# Patient Record
Sex: Female | Born: 1985 | Race: Black or African American | Hispanic: No | Marital: Single | Smoking: Never smoker
Health system: Southern US, Community
[De-identification: ages and names within clinical notes are randomized; demographics above are authoritative.]

## PROBLEM LIST (undated history)

## (undated) DIAGNOSIS — N939 Abnormal uterine and vaginal bleeding, unspecified: Secondary | ICD-10-CM

## (undated) DIAGNOSIS — I10 Essential (primary) hypertension: Secondary | ICD-10-CM

## (undated) DIAGNOSIS — D649 Anemia, unspecified: Secondary | ICD-10-CM

## (undated) DIAGNOSIS — L732 Hidradenitis suppurativa: Secondary | ICD-10-CM

## (undated) DIAGNOSIS — N946 Dysmenorrhea, unspecified: Secondary | ICD-10-CM

## (undated) DIAGNOSIS — E119 Type 2 diabetes mellitus without complications: Secondary | ICD-10-CM

## (undated) HISTORY — DX: Dysmenorrhea, unspecified: N94.6

## (undated) HISTORY — DX: Abnormal uterine and vaginal bleeding, unspecified: N93.9

## (undated) HISTORY — DX: Essential (primary) hypertension: I10

## (undated) HISTORY — DX: Type 2 diabetes mellitus without complications: E11.9

## (undated) HISTORY — DX: Hidradenitis suppurativa: L73.2

---
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
## 2003-12-01 ENCOUNTER — Ambulatory Visit: Payer: Self-pay | Admitting: Family Medicine

## 2004-07-07 ENCOUNTER — Inpatient Hospital Stay (HOSPITAL_COMMUNITY): Admission: AD | Admit: 2004-07-07 | Discharge: 2004-07-07 | Payer: Self-pay | Admitting: Obstetrics & Gynecology

## 2004-07-07 ENCOUNTER — Ambulatory Visit: Payer: Self-pay | Admitting: Family Medicine

## 2004-08-08 ENCOUNTER — Ambulatory Visit: Payer: Self-pay | Admitting: Family Medicine

## 2005-05-19 ENCOUNTER — Ambulatory Visit: Payer: Self-pay | Admitting: Family Medicine

## 2005-09-18 ENCOUNTER — Ambulatory Visit: Payer: Self-pay | Admitting: Family Medicine

## 2005-11-14 ENCOUNTER — Ambulatory Visit: Payer: Self-pay | Admitting: Family Medicine

## 2005-11-21 ENCOUNTER — Ambulatory Visit: Payer: Self-pay | Admitting: Family Medicine

## 2006-02-23 ENCOUNTER — Inpatient Hospital Stay (HOSPITAL_COMMUNITY): Admission: AD | Admit: 2006-02-23 | Discharge: 2006-02-23 | Payer: Self-pay | Admitting: Gynecology

## 2006-03-13 ENCOUNTER — Inpatient Hospital Stay (HOSPITAL_COMMUNITY): Admission: AD | Admit: 2006-03-13 | Discharge: 2006-03-13 | Payer: Self-pay | Admitting: Gynecology

## 2006-03-22 ENCOUNTER — Ambulatory Visit: Payer: Self-pay | Admitting: Obstetrics and Gynecology

## 2008-09-12 IMAGING — US US PELVIS COMPLETE MODIFY
1 series · 14 of 25 positions shown · non-contrast
Comparison: None.

CLINICAL DATA: Daily vaginal bleeding since [REDACTED]. The patient's last normal
menstrual period was in Saturday August, 2005.

TRANSABDOMINAL AND TRANSVAGINAL PELVIC ULTRASOUND
TECHNIQUE: Both transabdominal and transvaginal ultrasound examinations of the
pelvis were performed including evaluation of the uterus, ovaries, adnexal
regions, and pelvic cul-de-sac.

[Series 1: us pelvis complete modify · 0.39mm/px · 14 of 45 slices shown]
[im 1/45]
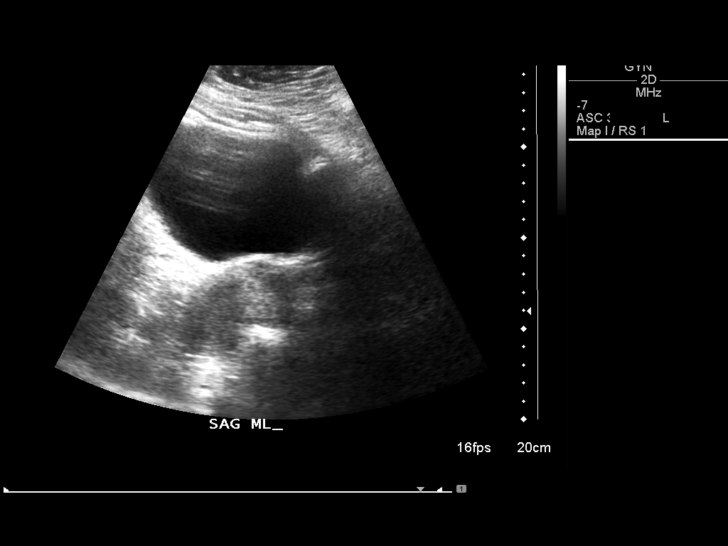
[im 4/45]
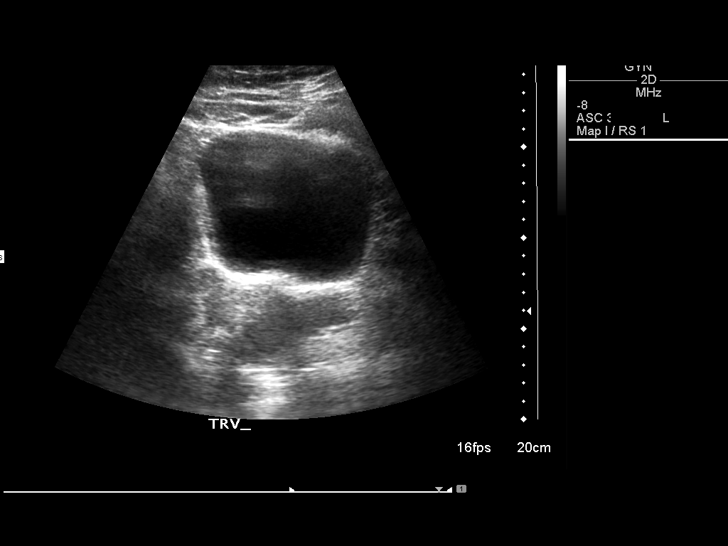
[im 8/45]
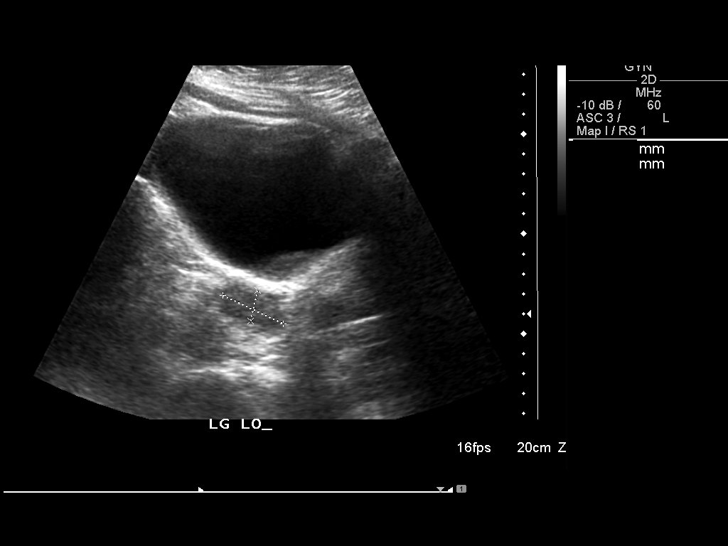
[im 12/45]
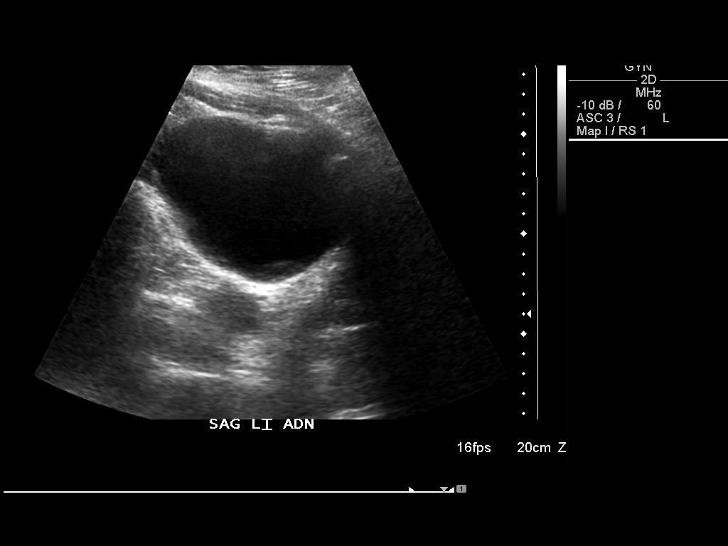
[im 15/45]
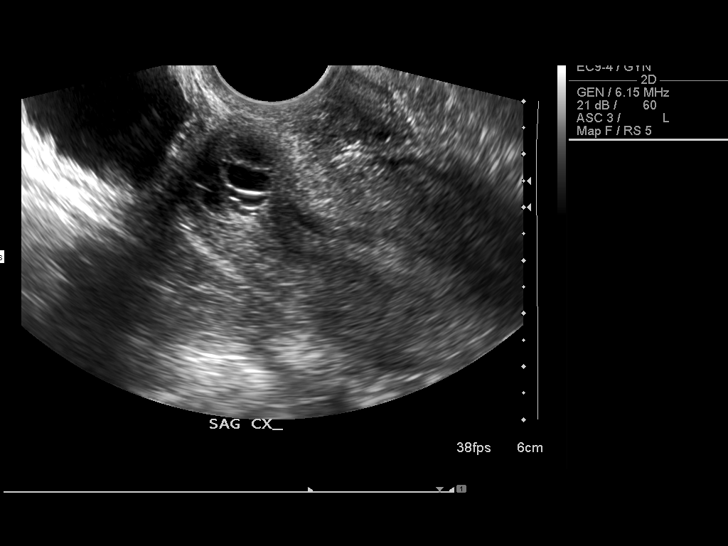
[im 17/45]
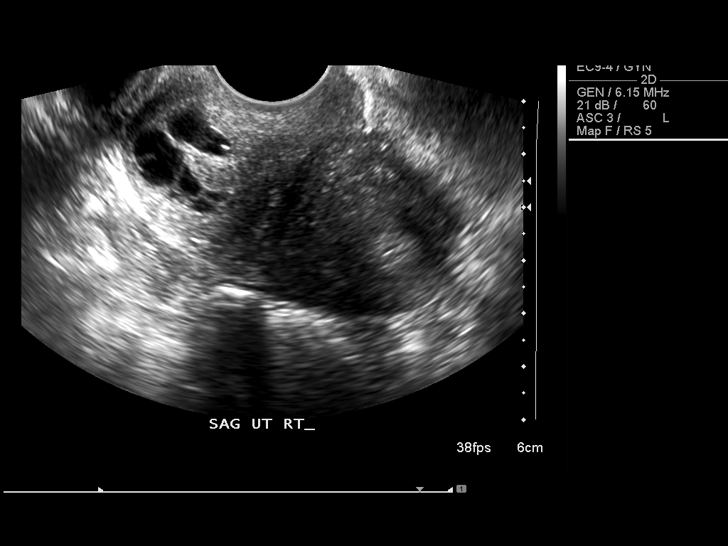
[im 21/45]
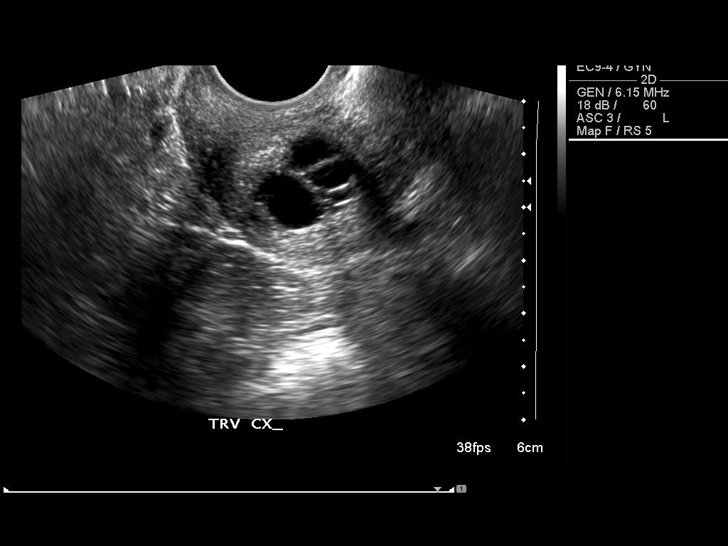
[im 24/45]
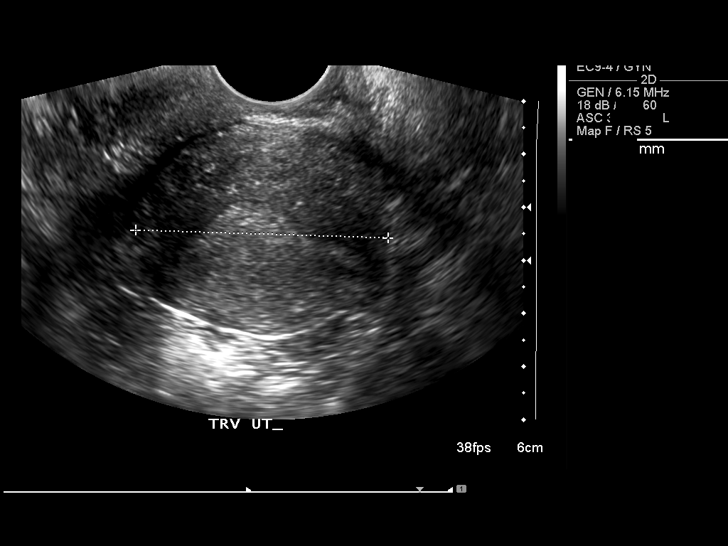
[im 28/45]
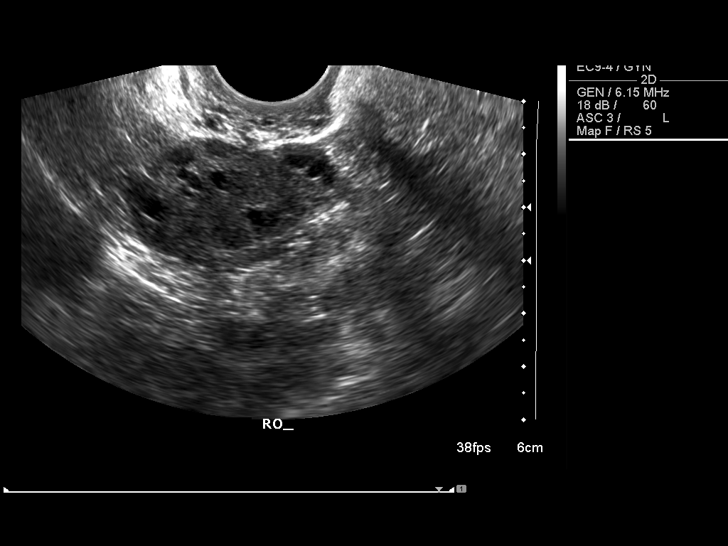
[im 30/45]
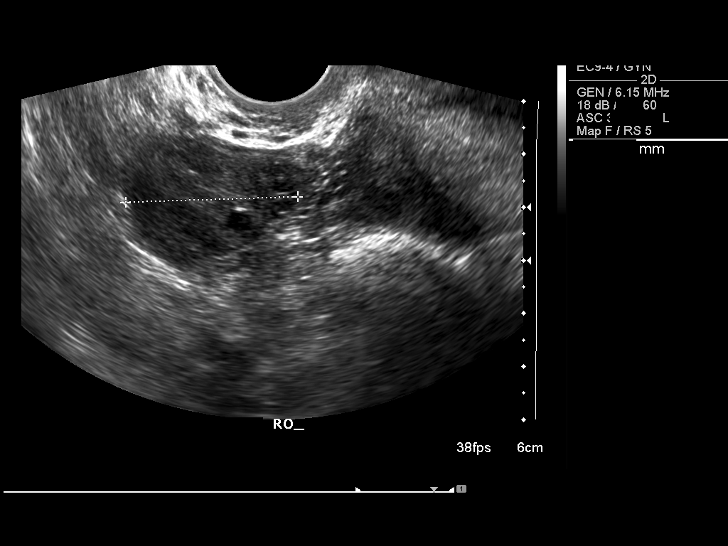
[im 34/45]
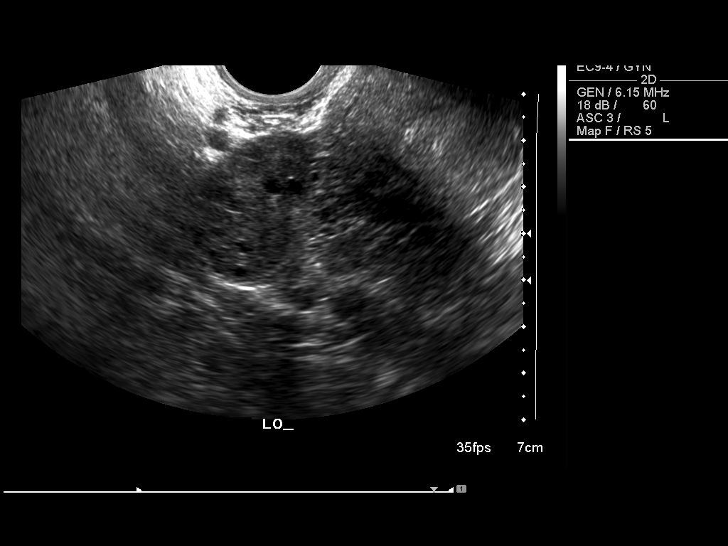
[im 37/45]
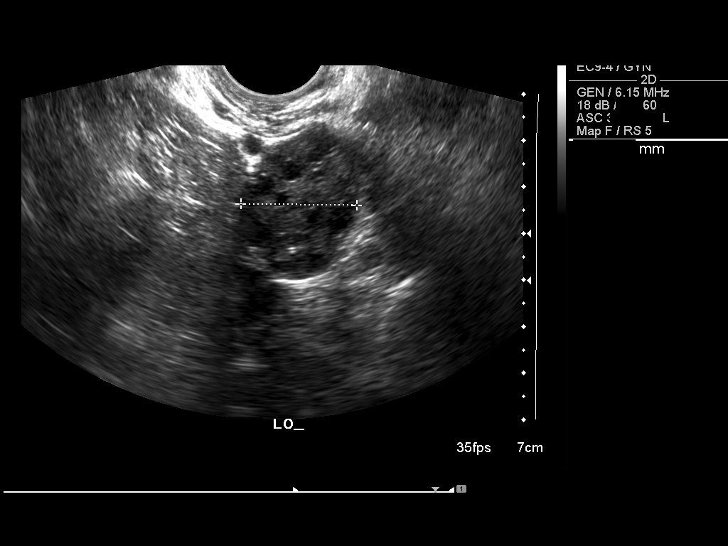
[im 41/45]
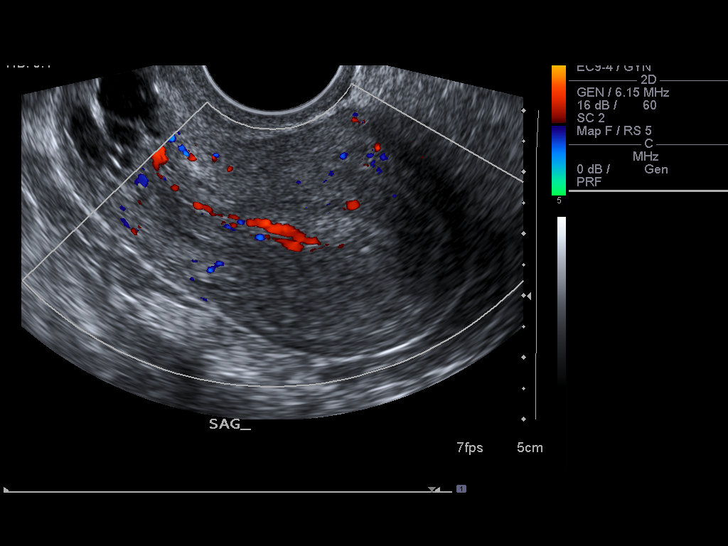
[im 45/45]
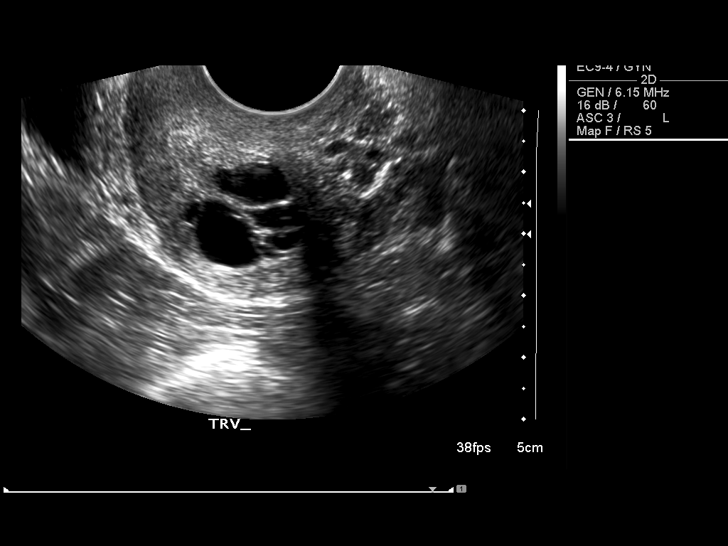

[14 of 25 positions shown; findings below may reference images not displayed]

FINDINGS: Normal appearing uterus with a normal appearing endometrial stripe
measuring 10.4 mm in maximum thickness transvaginally. Multiple nabothian cysts
incidentally noted. Normal appearing ovaries. No free peritoneal fluid.

IMPRESSION

Normal examination.

## 2009-05-20 ENCOUNTER — Emergency Department (HOSPITAL_COMMUNITY): Admission: EM | Admit: 2009-05-20 | Discharge: 2009-05-20 | Payer: Self-pay | Admitting: Family Medicine

## 2009-08-02 ENCOUNTER — Ambulatory Visit: Payer: Self-pay | Admitting: Internal Medicine

## 2009-08-02 ENCOUNTER — Encounter (INDEPENDENT_AMBULATORY_CARE_PROVIDER_SITE_OTHER): Payer: Self-pay | Admitting: Family Medicine

## 2009-08-02 LAB — CONVERTED CEMR LAB
ALT: 11 units/L (ref 0–35)
AST: 13 units/L (ref 0–37)
Albumin: 4.1 g/dL (ref 3.5–5.2)
Alkaline Phosphatase: 75 units/L (ref 39–117)
BUN: 11 mg/dL (ref 6–23)
Basophils Absolute: 0.1 10*3/uL (ref 0.0–0.1)
Eosinophils Absolute: 0.1 10*3/uL (ref 0.0–0.7)
Hemoglobin: 6.4 g/dL — CL (ref 12.0–15.0)
Lymphocytes Relative: 34 % (ref 12–46)
Lymphs Abs: 3.3 10*3/uL (ref 0.7–4.0)
Monocytes Absolute: 0.6 10*3/uL (ref 0.1–1.0)
Monocytes Relative: 6 % (ref 3–12)
Neutro Abs: 5.6 10*3/uL (ref 1.7–7.7)
Neutrophils Relative %: 58 % (ref 43–77)
Platelets: 495 10*3/uL — ABNORMAL HIGH (ref 150–400)
RDW: 21.2 % — ABNORMAL HIGH (ref 11.5–15.5)
Total Bilirubin: 0.5 mg/dL (ref 0.3–1.2)
Total Protein: 8.2 g/dL (ref 6.0–8.3)
WBC: 9.5 10*3/uL (ref 4.0–10.5)

## 2009-08-17 ENCOUNTER — Ambulatory Visit (HOSPITAL_COMMUNITY): Admission: RE | Admit: 2009-08-17 | Discharge: 2009-08-17 | Payer: Self-pay | Admitting: Internal Medicine

## 2009-08-17 ENCOUNTER — Ambulatory Visit: Payer: Self-pay | Admitting: Family Medicine

## 2009-08-17 LAB — CONVERTED CEMR LAB
Eosinophils Relative: 1 % (ref 0–5)
Lymphocytes Relative: 34 % (ref 12–46)
MCHC: 24.9 g/dL — ABNORMAL LOW (ref 30.0–36.0)
Monocytes Absolute: 0.7 10*3/uL (ref 0.1–1.0)
Monocytes Relative: 6 % (ref 3–12)
Neutrophils Relative %: 59 % (ref 43–77)
RDW: 23.2 % — ABNORMAL HIGH (ref 11.5–15.5)
WBC: 11.6 10*3/uL — ABNORMAL HIGH (ref 4.0–10.5)

## 2009-09-03 ENCOUNTER — Ambulatory Visit: Payer: Self-pay | Admitting: Internal Medicine

## 2009-09-16 ENCOUNTER — Ambulatory Visit: Payer: Self-pay | Admitting: Obstetrics & Gynecology

## 2009-09-16 ENCOUNTER — Other Ambulatory Visit: Admission: RE | Admit: 2009-09-16 | Discharge: 2009-09-16 | Payer: Self-pay | Admitting: Obstetrics and Gynecology

## 2010-04-29 LAB — POCT PREGNANCY, URINE: Preg Test, Ur: NEGATIVE

## 2012-03-06 IMAGING — CR DG KNEE AP/LAT W/ SUNRISE*L*
1 series · 1 of 1 positions shown · non-contrast
Comparison: None

CLINICAL DATA: Chronic knee pain

DG KNEE - 3 VIEWS

[t patella  left]
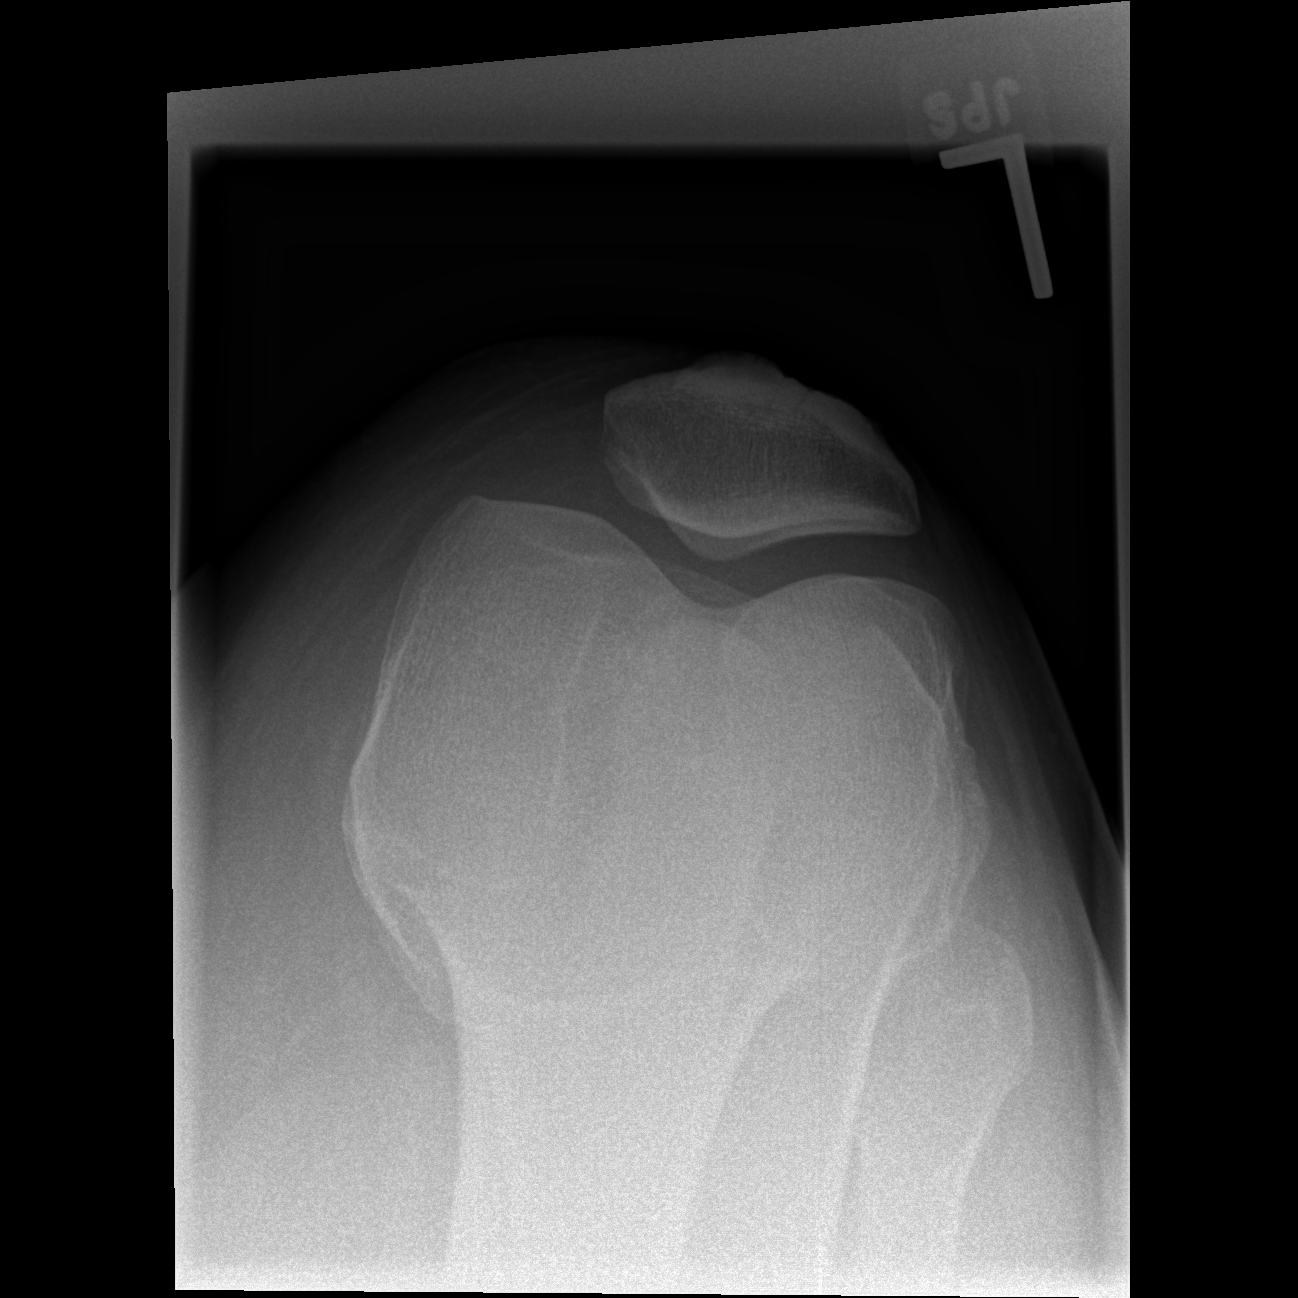

[1 of 1 positions shown; findings below may reference images not displayed]

FINDINGS: No joint effusion noted.

There is no fracture or dislocation identified.

Sharpening of the tibial spines noted.

There is no radiopaque foreign bodies or soft tissue
calcifications.
IMPRESSION: 1.  Mild DJD.

## 2012-03-06 IMAGING — US US PELVIS COMPLETE MODIFY
1 series · 14 of 25 positions shown · non-contrast
Comparison: None.

CLINICAL DATA: Menometrorrhagia



[Series 1: us pelvis complete modify · 0.21mm/px · 70 acquisitions, 14 frames shown]
[im 1/70]
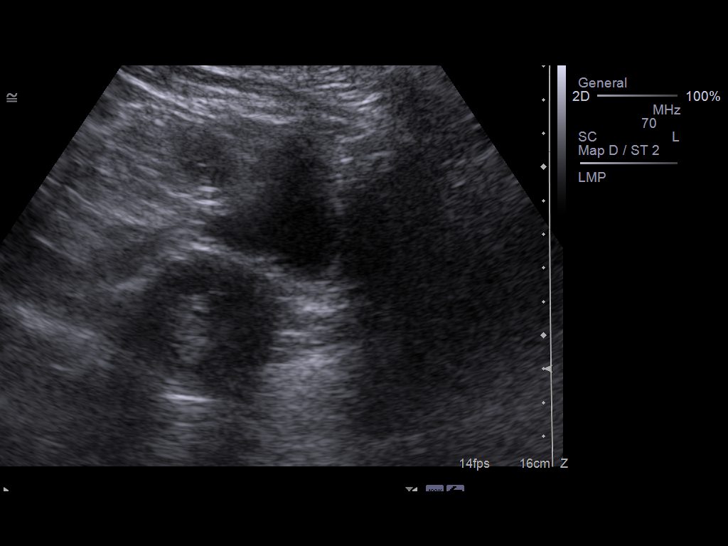
[im 6/70]
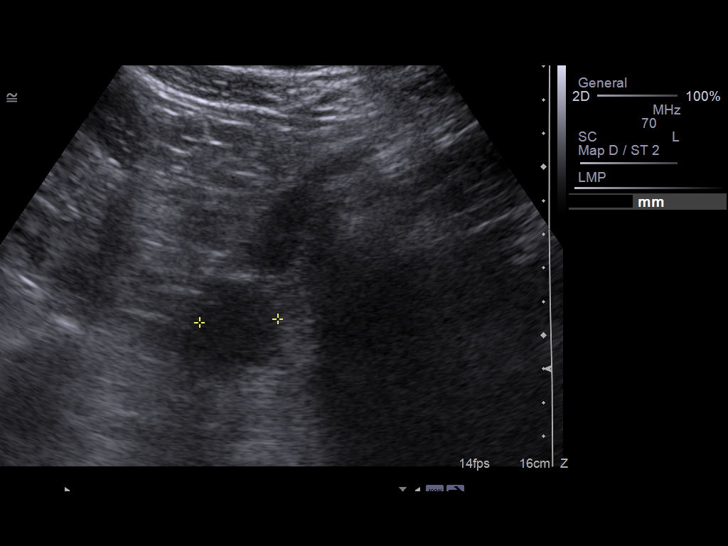
[im 12/70]
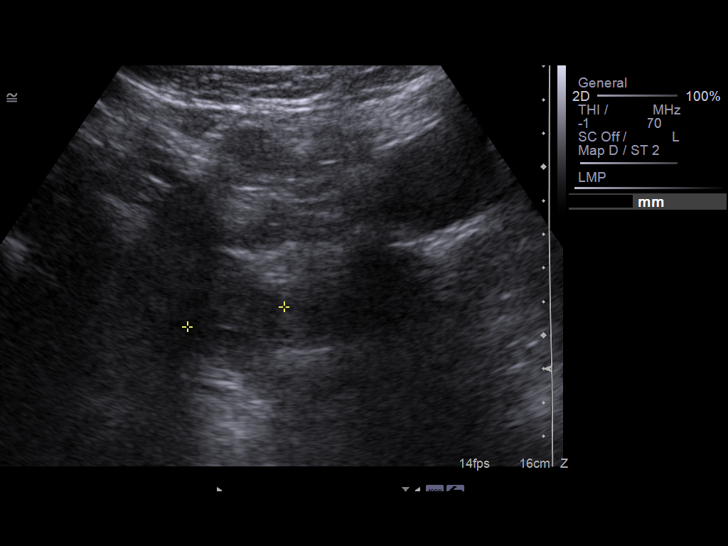
[im 18/70]
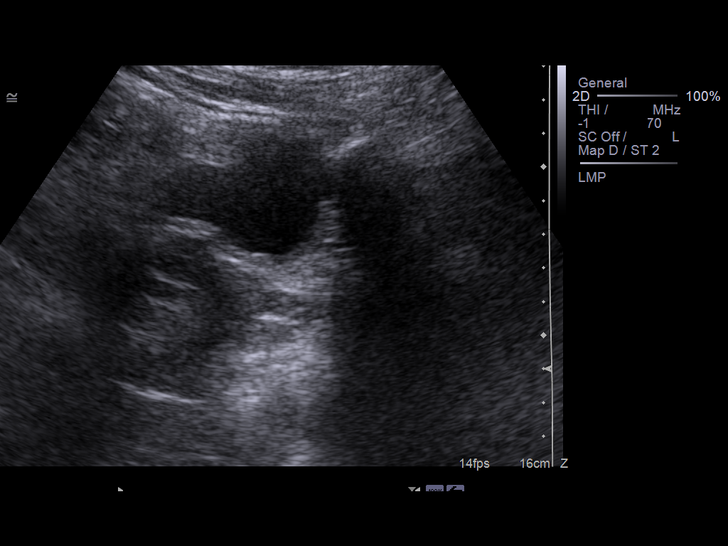
[im 24/70]
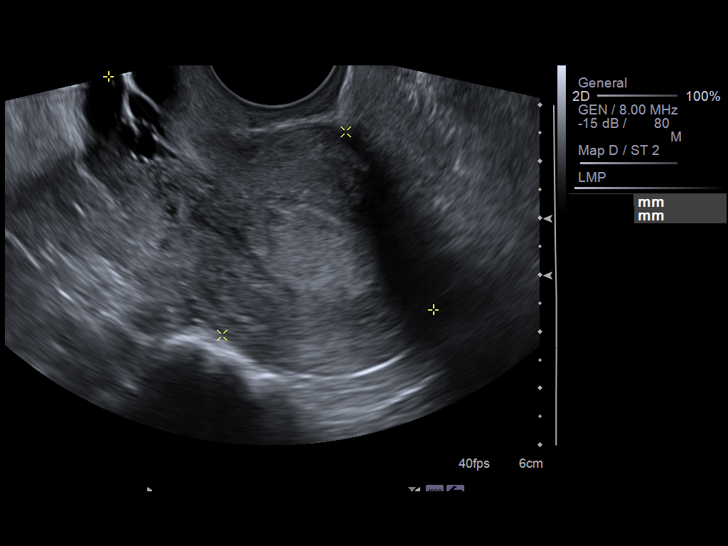
[im 26/70]
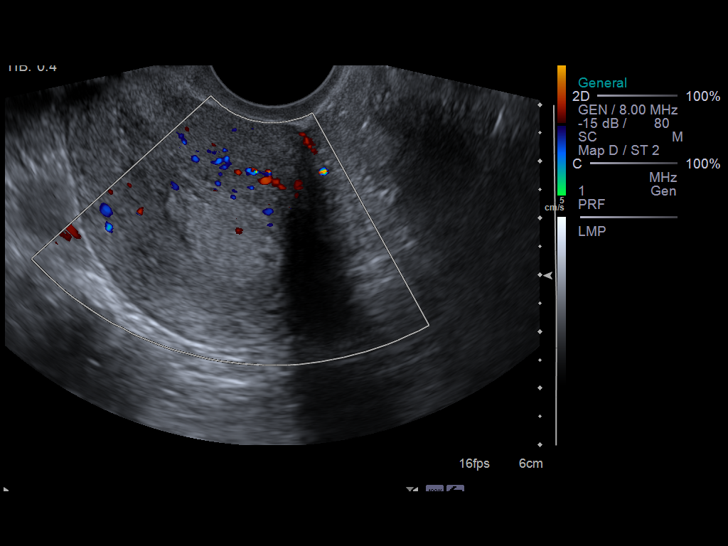
[im 32/70]
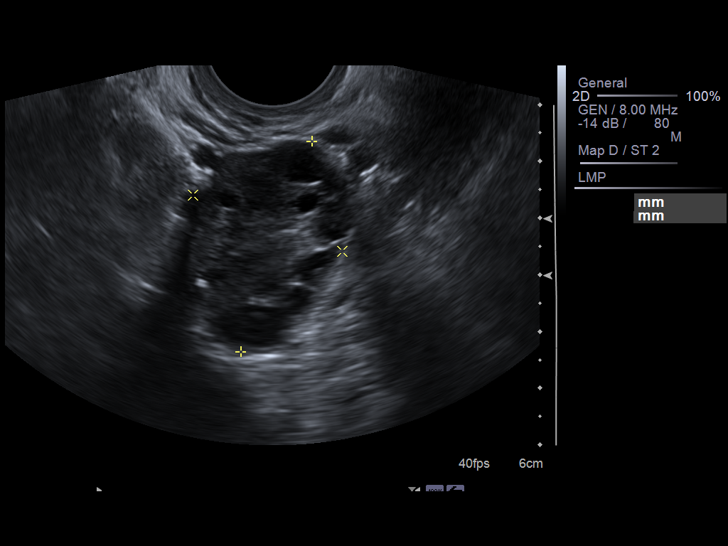
[im 38/70]
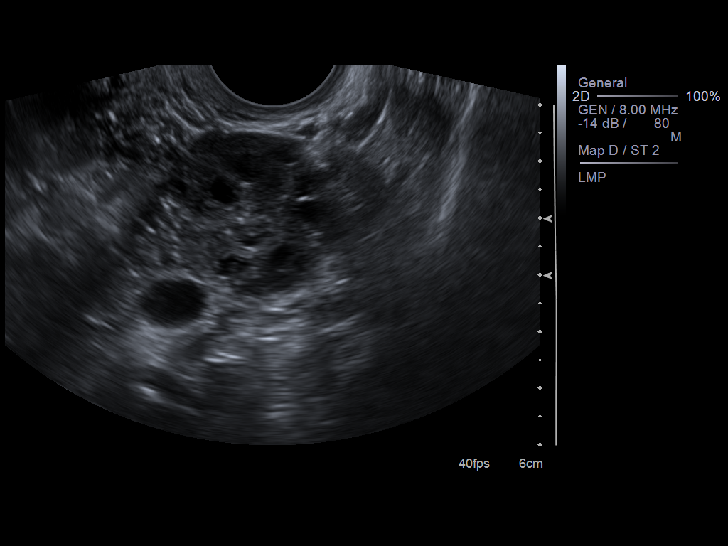
[im 44/70]
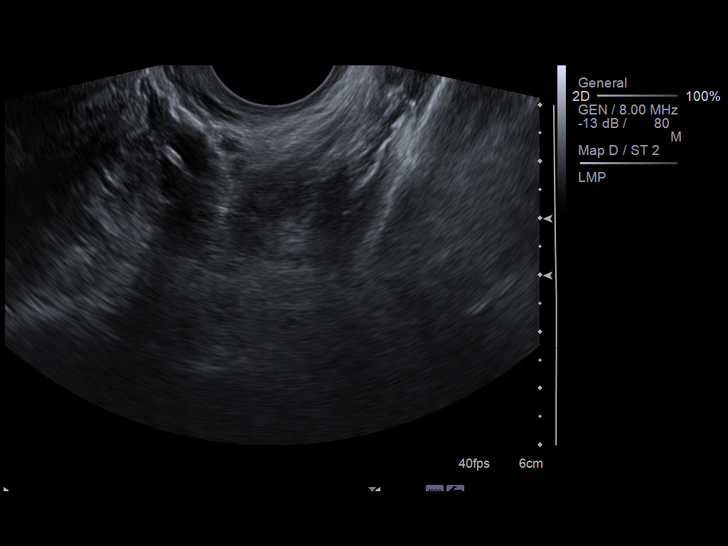
[im 47/70]
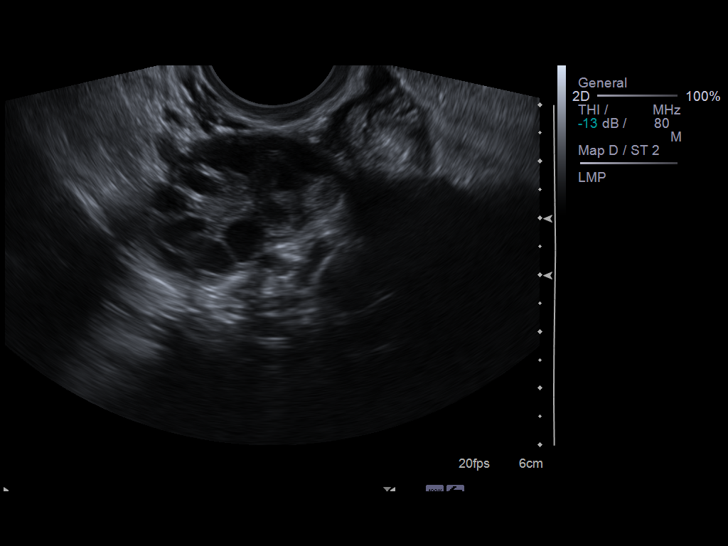
[im 52/70]
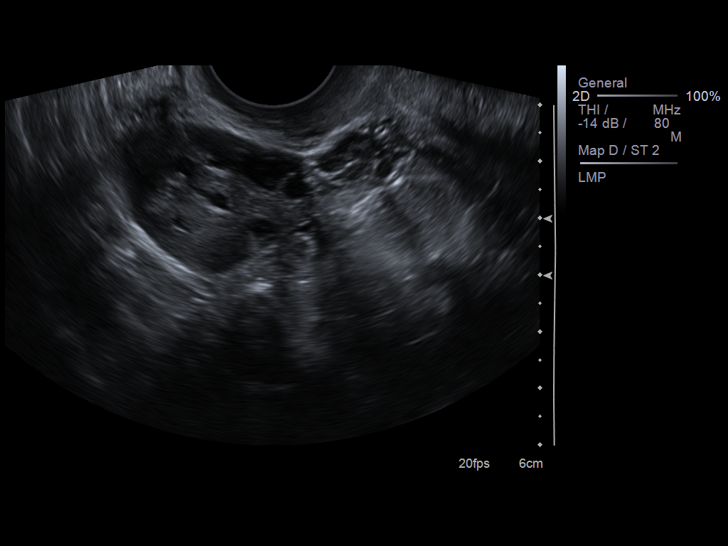
[im 58/70]
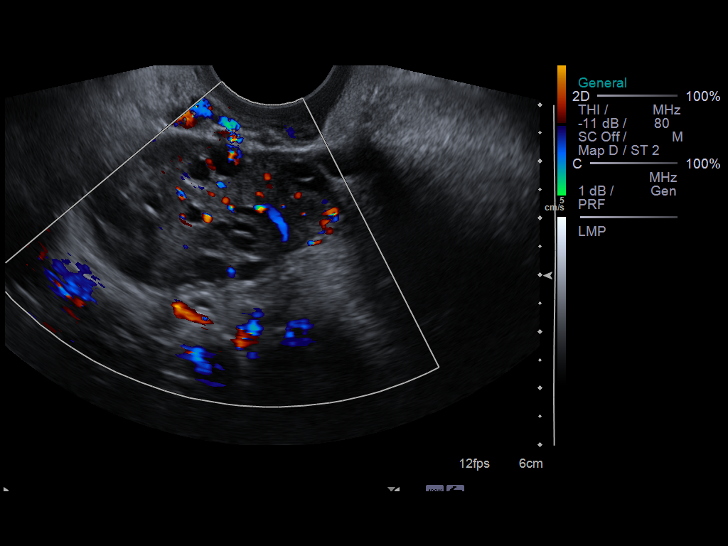
[im 64/70]
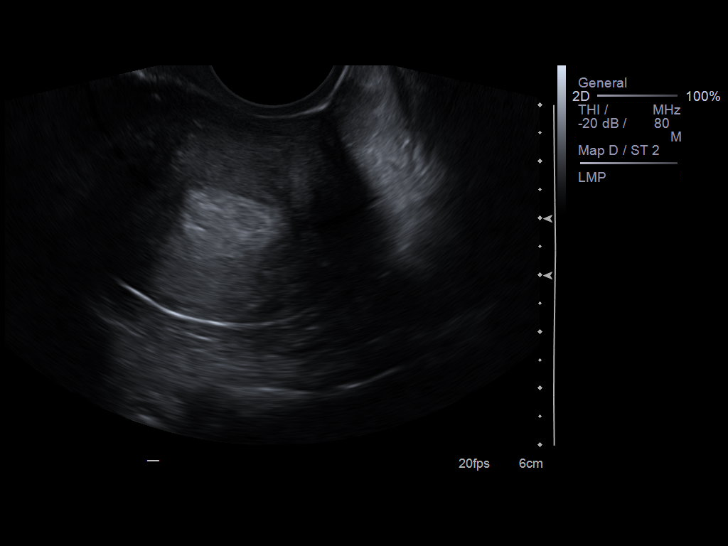
[im 70/70]
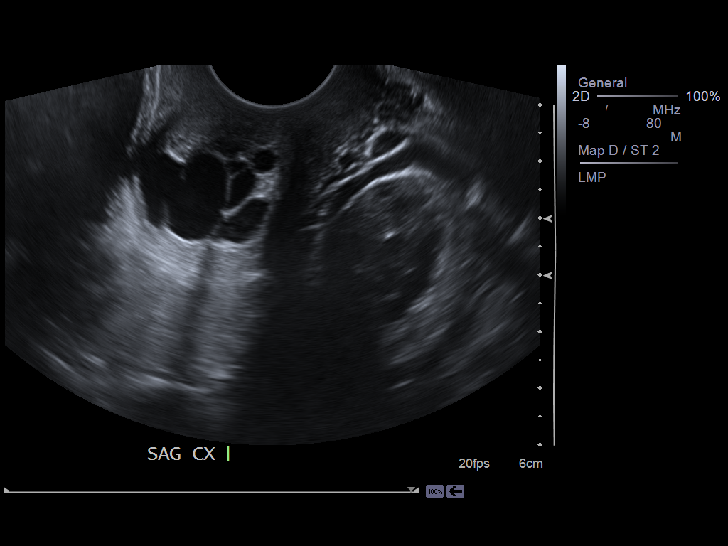

[14 of 25 positions shown; findings below may reference images not displayed]

FINDINGS: Uterus 7.1 x 5.3 x 4.2 cm.  Retroverted, retroflexed.  Normal.

Endometrium heterogeneously thickened and echogenic, 1.5 cm
maximally.  Suggestion of internal cystic spaces noted.

Right Ovary 4.7 x 3.5 x 2.7 cm.  Normal.

Left Ovary 3.9 x 3.4 x 2.8 cm.  Normal. 1.2 cm simple appearing
paraovarian cyst incidentally noted.

Other Findings:  Small amount of free pelvic fluid incidentally
identified.
IMPRESSION: Prominent endometrial thickness with suggestion of a few internal
cystic spaces.  Differential considerations include polyp,
hyperplasia, or neoplasia.  Consider sonohysterography for further
evaluation.

## 2017-02-13 DIAGNOSIS — D649 Anemia, unspecified: Secondary | ICD-10-CM

## 2017-02-13 HISTORY — DX: Anemia, unspecified: D64.9

## 2018-11-18 ENCOUNTER — Other Ambulatory Visit: Payer: Self-pay

## 2018-11-19 ENCOUNTER — Other Ambulatory Visit (HOSPITAL_COMMUNITY)
Admission: RE | Admit: 2018-11-19 | Discharge: 2018-11-19 | Disposition: A | Discharge status: Home / Self Care | Payer: 59 | Source: Ambulatory Visit | Attending: Obstetrics and Gynecology | Admitting: Obstetrics and Gynecology

## 2018-11-19 ENCOUNTER — Encounter: Payer: Self-pay | Admitting: Obstetrics and Gynecology

## 2018-11-19 ENCOUNTER — Ambulatory Visit: Payer: 59 | Admitting: Obstetrics and Gynecology

## 2018-11-19 VITALS — BP 116/70 | HR 70 | Temp 97.0°F | Resp 22 | Ht 66.0 in | Wt 281.0 lb

## 2018-11-19 DIAGNOSIS — D72829 Elevated white blood cell count, unspecified: Secondary | ICD-10-CM | POA: Diagnosis not present

## 2018-11-19 DIAGNOSIS — N921 Excessive and frequent menstruation with irregular cycle: Secondary | ICD-10-CM | POA: Diagnosis not present

## 2018-11-19 DIAGNOSIS — Z113 Encounter for screening for infections with a predominantly sexual mode of transmission: Secondary | ICD-10-CM

## 2018-11-19 DIAGNOSIS — N930 Postcoital and contact bleeding: Secondary | ICD-10-CM

## 2018-11-19 NOTE — Progress Notes (Signed)
GYNECOLOGY  VISIT   HPI: 33 y.o.   Single  African American  female   [id number] with Patient's last menstrual period was 08/16/2018 (approximate).  Started June 26. (prior LMP was May 2 - May 4) here for heavy/irregular menses.    Hx of a lot of bleeding issues.  She had a pelvic ultrasound in [place].  She thinks she may have a mass on her uterus or her tube.   She was told this is avascular.  She had an in office hysteroscopy and had benign polyps removed in May 2020.  She is working with her gynecologist to control bleeding further.  Her current problem is that she is continuously bleeding, since July, 2020.  Blood can be dark or red. Quantity can range from a little to a lot.  Taking Prometrium 200 mg daily and continuously.  Just started this in May.  It did stop heavy bleeding and clots when she first started.   She called the office to make this appointment because 2 days ago, she had heavy postcoital bleeding for the first time.  No pain.  Her last prior intercourse was February.   Before January, 2020 she had monthly period for 3 days.  Starting in January she started having periods 2 times a month.   She has tried COC, Micronor, Aygestin.   She has a hx of anemia.  Normal Hgb in May. Always feels tired.  Her thyroid was normal this year per patient.  Difficulty loosing weight.   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  Now working from home during the pandemic.  Living with her grandmother who needs some assistance at home.  GYNECOLOGIC HISTORY: Patient's last menstrual period was 08/16/2018 (approximate). Contraception:  Condoms sometime Menopausal hormone therapy:  n/a Last mammogram:  n/a Last pap smear: 2020 normal in [place].    Hx abnormal pap and hx colposcopy.         OB History    Gravida  0   Para  0   Term  0   Preterm  0   AB  0   Living  0     SAB  0   TAB  0   Ectopic  0   Multiple  0   Live Births  0               There are no active problems to display for this patient.   Past Medical History:  Diagnosis Date  . Abnormal uterine bleeding   . Anemia 2019   treated with "high dose" iron tablets  . Diabetes mellitus without complication (HCC)   . Dysmenorrhea   . Hidradenitis   . Hypertension     Past Surgical History:  Procedure Laterality Date  . CHOLECYSTECTOMY    . excession of sweat glands     under both arms,breasts.abdomen and left groin  . wisdom teeth      Current Outpatient Medications  Medication Sig Dispense Refill  . glipiZIDE (GLUCOTROL XL) 2.5 MG 24 hr tablet Take 2.5 mg by mouth daily with breakfast.    . progesterone (PROMETRIUM) 200 MG capsule Take 200 mg by mouth daily.    Marland Kitchen spironolactone (ALDACTONE) 100 MG tablet Take 100 mg by mouth 2 (two) times daily.    . Nutritional Supplements (DHEA) 15-50 MG CAPS Take 1 tablet by mouth daily.     No current facility-administered medications for this visit.      ALLERGIES: Metformin and related and  Penicillins  Family History  Problem Relation Age of Onset  . Hypertension Father   . Cancer Father 53       liver cancer  . Diabetes Sister   . Diabetes Maternal Grandmother   . Stroke Maternal Grandmother   . Cancer Maternal Grandfather        lung cancer    Social History   Socioeconomic History  . Marital status: Single    Spouse name: Not on file  . Number of children: Not on file  . Years of education: Not on file  . Highest education level: Not on file  Occupational History  . Not on file  Social Needs  . Financial resource strain: Not on file  . Food insecurity    Worry: Not on file    Inability: Not on file  . Transportation needs    Medical: Not on file    Non-medical: Not on file  Tobacco Use  . Smoking status: Never Smoker  . Smokeless tobacco: Never Used  Substance and Sexual Activity  . Alcohol use: Yes    Comment: social drinker  . Drug use: Never  . Sexual activity: Yes    Birth  control/protection: Condom    Comment: condoms most of time  Lifestyle  . Physical activity    Days per week: Not on file    Minutes per session: Not on file  . Stress: Not on file  Relationships  . Social Herbalist on phone: Not on file    Gets together: Not on file    Attends religious service: Not on file    Active member of club or organization: Not on file    Attends meetings of clubs or organizations: Not on file    Relationship status: Not on file  . Intimate partner violence    Fear of current or ex partner: Not on file    Emotionally abused: Not on file    Physically abused: Not on file    Forced sexual activity: Not on file  Other Topics Concern  . Not on file  Social History Narrative  . Not on file    Review of Systems  All other systems reviewed and are negative.   PHYSICAL EXAMINATION:    BP 116/70 (Cuff Size: Large)   Pulse 70   Temp (!) 97 F (36.1 C) (Temporal)   Resp (!) 22   Ht 5\' 6"  (1.676 m)   Wt 281 lb (127.5 kg)   LMP 08/16/2018 (Approximate)   BMI 45.35 kg/m     General appearance: alert, cooperative and appears stated age Head: Normocephalic, without obvious abnormality, atraumatic Neck: no adenopathy, supple, symmetrical, trachea midline and thyroid normal to inspection and palpation Lungs: clear to auscultation bilaterally Breasts: normal appearance, no masses or tenderness, No nipple retraction or dimpling, No nipple discharge or bleeding, No axillary or supraclavicular adenopathy Heart: regular rate and rhythm Abdomen: scar across lower abdomen from hip to hip.  Abdomen is soft, non-tender, no masses,  no organomegaly Extremities: extremities normal, atraumatic, no cyanosis or edema Skin: Skin color, texture, turgor normal. No rashes or lesions Lymph nodes: Cervical, supraclavicular, and axillary nodes normal. No abnormal inguinal nodes palpated Neurologic: Grossly normal  Pelvic: External genitalia:  no lesions               Urethra:  normal appearing urethra with no masses, tenderness or lesions  Bartholins and Skenes: normal                 Vagina: normal appearing vagina with normal color and discharge, no lesions              Cervix: no lesions                Bimanual Exam:  Uterus:  normal size, contour, position, consistency, mobility, non-tender              Adnexa: no mass, fullness, tenderness            Chaperone was present for exam.  ASSESSMENT  Menorrhagia with irregular menses. Hx hidradenitis.  Status post surgical resection.  DM.   PLAN  We discussed abnormal uterine bleeding and potential etiologies - polyps, fibroids, anovulation, infection. TSH, CBC.  GC/CT/trich.  Return for sonohysterogram and possible EMB.  Ok to continue her Rx for Prometrium at this time.  May be a candidate for other tx options such as Mirena IUD.    An After Visit Summary was printed and given to the patient.  ____30__ minutes face to face time of which over 50% was spent in counseling.

## 2018-11-20 LAB — CBC
Hematocrit: 40.2 % (ref 34.0–46.6)
Hemoglobin: 12.8 g/dL (ref 11.1–15.9)
MCH: 25.5 pg — ABNORMAL LOW (ref 26.6–33.0)
MCHC: 31.8 g/dL (ref 31.5–35.7)
MCV: 80 fL (ref 79–97)
Platelets: 385 10*3/uL (ref 150–450)
RBC: 5.01 x10E6/uL (ref 3.77–5.28)
RDW: 15.2 % (ref 11.7–15.4)
WBC: 11.8 10*3/uL — ABNORMAL HIGH (ref 3.4–10.8)

## 2018-11-20 LAB — TSH: TSH: 1.72 u[IU]/mL (ref 0.450–4.500)

## 2018-11-21 ENCOUNTER — Telehealth: Payer: Self-pay | Admitting: Obstetrics and Gynecology

## 2018-11-21 LAB — CERVICOVAGINAL ANCILLARY ONLY
Chlamydia: NEGATIVE
Comment: NEGATIVE
Comment: NEGATIVE
Comment: NORMAL
Neisseria Gonorrhea: NEGATIVE
Trichomonas: NEGATIVE

## 2018-11-21 NOTE — Telephone Encounter (Signed)
Call placed to patient to review benefit for sonohysterogram and endometrial biopsy. Patient acknowledges understanding of information presented. Patient is scheduled 12/05/2018 with Dr Quincy Simmonds. Patient is aware fo the appointment date, arrival time and cancellation policy. No further quesitons.  Forwarding to Dr Quincy Simmonds for final review. Patient is agreeable to disposition. Will close encounter

## 2018-12-03 ENCOUNTER — Other Ambulatory Visit: Payer: Self-pay

## 2018-12-05 ENCOUNTER — Encounter: Payer: Self-pay | Admitting: Obstetrics and Gynecology

## 2018-12-05 ENCOUNTER — Ambulatory Visit: Payer: 59 | Admitting: Obstetrics and Gynecology

## 2018-12-05 ENCOUNTER — Other Ambulatory Visit: Payer: Self-pay | Admitting: Obstetrics and Gynecology

## 2018-12-05 ENCOUNTER — Ambulatory Visit (INDEPENDENT_AMBULATORY_CARE_PROVIDER_SITE_OTHER): Payer: 59

## 2018-12-05 ENCOUNTER — Other Ambulatory Visit: Payer: Self-pay

## 2018-12-05 VITALS — BP 126/82 | HR 66 | Temp 97.6°F | Ht 66.0 in | Wt 288.0 lb

## 2018-12-05 DIAGNOSIS — N921 Excessive and frequent menstruation with irregular cycle: Secondary | ICD-10-CM

## 2018-12-05 DIAGNOSIS — D72829 Elevated white blood cell count, unspecified: Secondary | ICD-10-CM

## 2018-12-05 DIAGNOSIS — N83201 Unspecified ovarian cyst, right side: Secondary | ICD-10-CM

## 2018-12-05 NOTE — Patient Instructions (Addendum)
Endometrial Biopsy, Care After This sheet gives you information about how to care for yourself after your procedure. Your health care provider may also give you more specific instructions. If you have problems or questions, contact your health care provider. What can I expect after the procedure? After the procedure, it is common to have:  Mild cramping.  A small amount of vaginal bleeding for a few days. This is normal. Follow these instructions at home:   Take over-the-counter and prescription medicines only as told by your health care provider.  Do not douche, use tampons, or have sexual intercourse until your health care provider approves.  Return to your normal activities as told by your health care provider. Ask your health care provider what activities are safe for you.  Follow instructions from your health care provider about any activity restrictions, such as restrictions on strenuous exercise or heavy lifting. Contact a health care provider if:  You have heavy bleeding, or bleed for longer than 2 days after the procedure.  You have bad smelling discharge from your vagina.  You have a fever or chills.  You have a burning sensation when urinating or you have difficulty urinating.  You have severe pain in your lower abdomen. Get help right away if:  You have severe cramps in your stomach or back.  You pass large blood clots.  Your bleeding increases.  You become weak or light-headed, or you pass out. Summary  After the procedure, it is common to have mild cramping and a small amount of vaginal bleeding for a few days.  Do not douche, use tampons, or have sexual intercourse until your health care provider approves.  Return to your normal activities as told by your health care provider. Ask your health care provider what activities are safe for you. This information is not intended to replace advice given to you by your health care provider. Make sure you discuss any  questions you have with your health care provider. Document Released: 11/20/2012 Document Revised: 01/12/2017 Document Reviewed: 02/16/2016 Elsevier Patient Education  2020 [employer].  Ethinyl Estradiol; Levonorgestrel tablets What is this medicine? ETHINYL ESTRADIOL; LEVONORGESTREL (ETH in il es tra DYE ole; LEE voh nor jes trel) is an oral contraceptive. It combines two types of female hormones, an estrogen and a progestin. They are used to prevent ovulation and pregnancy. This medicine may be used for other purposes; ask your health care provider or pharmacist if you have questions. COMMON BRAND NAME(S): [place], [place], [place], [place], Amethia Lo, [place], [place], [place], [place], Av[place], [place], Camrese Lo, [place], [place], [place], [address], [place], [place], [place], [place], [place], [place], [place], [place], [place], [place], [place], [place], Levonorgestrel/Ethinyl Estradiol, [place], [place], [place], [place], [place], [place], [place], [place], [place], [place], [place], [place], [place], [place], [place], [place], [place], [place], [place], Debroah Baller What should I tell my health care provider before I take this medicine? They need to know if you have or ever had any of these conditions:  abnormal vaginal bleeding  blood vessel disease or blood clots  breast, cervical, endometrial, ovarian, liver, or uterine cancer  diabetes  gallbladder disease  heart disease or recent heart attack  high blood pressure  high cholesterol  kidney disease  liver disease  migraine headaches  stroke  systemic lupus erythematosus (SLE)  tobacco smoker  an unusual or allergic reaction to estrogens, progestins, other medicines, foods, dyes, or preservatives  pregnant or trying to get pregnant  breast-feeding How should I use this medicine? Take this medicine by  mouth. To reduce nausea, this medicine may be taken with food. Follow the  directions on the prescription label. Take this medicine at the same time each day and in the order directed on the package. Do not take your medicine more often than directed. Contact your pediatrician regarding the use of this medicine in children. Special care may be needed. This medicine has been used in female children who have started having menstrual periods. A patient package insert for the product will be given with each prescription and refill. Read this sheet carefully each time. The sheet may change frequently. Overdosage: If you think you have taken too much of this medicine contact a poison control center or emergency room at once. NOTE: This medicine is only for you. Do not share this medicine with others. What if I miss a dose? If you miss a dose, refer to the patient information sheet you received with your medicine for direction. If you miss more than one pill, this medicine may not be as effective and you may need to use another form of birth control. What may interact with this medicine? Do not take this medicine with the following medication:  dasabuvir; ombitasvir; paritaprevir; ritonavir  ombitasvir; paritaprevir; ritonavir This medicine may also interact with the following medications:  acetaminophen  antibiotics or medicines for infections, especially rifampin, rifabutin, rifapentine, and griseofulvin, and possibly penicillins or tetracyclines  aprepitant  ascorbic acid (vitamin C)  atorvastatin  barbiturate medicines, such as phenobarbital  bosentan  carbamazepine  caffeine  clofibrate  cyclosporine  dantrolene  doxercalciferol  felbamate  grapefruit juice  hydrocortisone  medicines for anxiety or sleeping problems, such as diazepam or temazepam  medicines for diabetes, including pioglitazone  mineral oil  modafinil  mycophenolate  nefazodone  oxcarbazepine  phenytoin  prednisolone  ritonavir or other medicines for HIV  infection or AIDS  rosuvastatin  selegiline  soy isoflavones supplements  St. John's wort  tamoxifen or raloxifene  theophylline  thyroid hormones  topiramate  warfarin This list may not describe all possible interactions. Give your health care provider a list of all the medicines, herbs, non-prescription drugs, or dietary supplements you use. Also tell them if you smoke, drink alcohol, or use illegal drugs. Some items may interact with your medicine. What should I watch for while using this medicine? Visit your doctor or health care professional for regular checks on your progress. You will need a regular breast and pelvic exam and Pap smear while on this medicine. Use an additional method of contraception during the first cycle that you take these tablets. If you have any reason to think you are pregnant, stop taking this medicine right away and contact your doctor or health care professional. If you are taking this medicine for hormone related problems, it may take several cycles of use to see improvement in your condition. Smoking increases the risk of getting a blood clot or having a stroke while you are taking birth control pills, especially if you are more than 35 years old. You are strongly advised not to smoke. This medicine can make your body retain fluid, making your fingers, hands, or ankles swell. Your blood pressure can go up. Contact your doctor or health care professional if you feel you are retaining fluid. This medicine can make you more sensitive to the sun. Keep out of the sun. If you cannot avoid being in the sun, wear protective clothing and use sunscreen. Do not use sun lamps or tanning beds/booths. If you wear  contact lenses and notice visual changes, or if the lenses begin to feel uncomfortable, consult your eye care specialist. In some women, tenderness, swelling, or minor bleeding of the gums may occur. Notify your dentist if this happens. Brushing and flossing  your teeth regularly may help limit this. See your dentist regularly and inform your dentist of the medicines you are taking. If you are going to have elective surgery, you may need to stop taking this medicine before the surgery. Consult your health care professional for advice. This medicine does not protect you against HIV infection (AIDS) or any other sexually transmitted diseases. What side effects may I notice from receiving this medicine? Side effects that you should report to your doctor or health care professional as soon as possible:  breast tissue changes or discharge  changes in vaginal bleeding during your period or between your periods  chest pain  coughing up blood  dizziness or fainting spells  headaches or migraines  leg, arm or groin pain  severe or sudden headaches  stomach pain (severe)  sudden shortness of breath  sudden loss of coordination, especially on one side of the body  speech problems  symptoms of vaginal infection like itching, irritation or unusual discharge  tenderness in the upper abdomen  vomiting  weakness or numbness in the arms or legs, especially on one side of the body  yellowing of the eyes or skin Side effects that usually do not require medical attention (report to your doctor or health care professional if they continue or are bothersome):  breakthrough bleeding and spotting that continues beyond the 3 initial cycles of pills  breast tenderness  mood changes, anxiety, depression, frustration, anger, or emotional outbursts  increased sensitivity to sun or ultraviolet light  nausea  skin rash, acne, or brown spots on the skin  weight gain (slight) This list may not describe all possible side effects. Call your doctor for medical advice about side effects. You may report side effects to FDA at 1-800-FDA-1088. Where should I keep my medicine? Keep out of the reach of children. Store at room temperature between 15 and 30  degrees C (59 and 86 degrees F). Throw away any unused medicine after the expiration date. NOTE: This sheet is a summary. It may not cover all possible information. If you have questions about this medicine, talk to your doctor, pharmacist, or health care provider.  2020 Elsevier/Gold Standard (2015-10-11 07:58:22)

## 2018-12-05 NOTE — Progress Notes (Signed)
GYNECOLOGY  VISIT   HPI: 33 y.o.   Single  African American  female   G0P0000 with Patient's last menstrual period was 11/25/2018.   here for  sonohysterogram and endometrial biopsy due to menorrhagia with irregular menses.   She takes daily Prometrium 200 mg.   Hx endometrial polyp.   HgbA1C 9.2 on 07/10/18 on chart review from [place].  States she has hx of HTN.  She is not currently on any medication for HTN.  Denies smoking and vaping, migraine with aura, liver or breast disease, personal or family hx thromboembolic events.  Not looking for contraception.  She is not sexually active very often.   Use Ortho [facility] in the past.   GYNECOLOGIC HISTORY: Patient's last menstrual period was 11/25/2018. Contraception:  Condoms sometimes.   Menopausal hormone therapy:  NA Last mammogram:  NA Last pap smear:   2020 normal in [place].        OB History    Gravida  0   Para  0   Term  0   Preterm  0   AB  0   Living  0     SAB  0   TAB  0   Ectopic  0   Multiple  0   Live Births  0              There are no active problems to display for this patient.   Past Medical History:  Diagnosis Date  . Abnormal uterine bleeding   . Anemia 2019   treated with "high dose" iron tablets  . Diabetes mellitus without complication (HCC)   . Dysmenorrhea   . Hidradenitis   . Hypertension     Past Surgical History:  Procedure Laterality Date  . CHOLECYSTECTOMY    . excession of sweat glands     under both arms,breasts.abdomen and left groin  . wisdom teeth      Current Outpatient Medications  Medication Sig Dispense Refill  . doxycycline (DORYX) 100 MG EC tablet Take 100 mg by mouth 2 (two) times daily.    Marland Kitchen glipiZIDE (GLUCOTROL XL) 2.5 MG 24 hr tablet Take 2.5 mg by mouth daily with breakfast.    . Nutritional Supplements (DHEA) 15-50 MG CAPS Take 1 tablet by mouth daily.    . Probiotic Product (PROBIOTIC ADVANCED PO) Take 1 tablet by mouth daily. Vaginal  health probiotic    . progesterone (PROMETRIUM) 200 MG capsule Take 200 mg by mouth daily.    Marland Kitchen spironolactone (ALDACTONE) 100 MG tablet Take 100 mg by mouth 2 (two) times daily.     No current facility-administered medications for this visit.      ALLERGIES: Metformin and related and Penicillins  Family History  Problem Relation Age of Onset  . Hypertension Father   . Cancer Father 16       liver cancer  . Diabetes Sister   . Diabetes Maternal Grandmother   . Stroke Maternal Grandmother   . Cancer Maternal Grandfather        lung cancer    Social History   Socioeconomic History  . Marital status: Single    Spouse name: Not on file  . Number of children: Not on file  . Years of education: Not on file  . Highest education level: Not on file  Occupational History  . Not on file  Social Needs  . Financial resource strain: Not on file  . Food insecurity    Worry: Not on  file    Inability: Not on file  . Transportation needs    Medical: Not on file    Non-medical: Not on file  Tobacco Use  . Smoking status: Never Smoker  . Smokeless tobacco: Never Used  Substance and Sexual Activity  . Alcohol use: Yes    Comment: social drinker  . Drug use: Never  . Sexual activity: Yes    Birth control/protection: Condom    Comment: condoms most of time  Lifestyle  . Physical activity    Days per week: Not on file    Minutes per session: Not on file  . Stress: Not on file  Relationships  . Social Herbalist on phone: Not on file    Gets together: Not on file    Attends religious service: Not on file    Active member of club or organization: Not on file    Attends meetings of clubs or organizations: Not on file    Relationship status: Not on file  . Intimate partner violence    Fear of current or ex partner: Not on file    Emotionally abused: Not on file    Physically abused: Not on file    Forced sexual activity: Not on file  Other Topics Concern  . Not on  file  Social History Narrative  . Not on file    Review of Systems  See HPI.  PHYSICAL EXAMINATION:    BP 126/82 (Cuff Size: Large)   Pulse 66   Temp 97.6 F (36.4 C) (Temporal)   Ht 5\' 6"  (1.676 m)   Wt 288 lb (130.6 kg)   LMP 11/25/2018   BMI 46.48 kg/m     General appearance: alert, cooperative and appears stated age   Pelvic US Uterus no masses. EMS 5.10 mm.  Right ovary 16 x 14 mm cyst consistent with dermoid cyst. No abnormal blood flow. Left ovary normal. No free fluid.   Sonohystereogram/EMB Consent for procedures.  Sterile prep with Hibiclens.  Sterile NS injected into uterine cavity through cannula and no filling defect noted.  EMB performed under sterile conditions to 8.5 cm x 2.  Tissue to pathology.  No complications.  Minimal EBL.  Chaperone was present for exam.  ASSESSMENT  Menorrhagia with irregular menses.  Right ovarian dermoid cysts. Uncontrolled diabetes.  Elevated WBC on chart review including outside chart.  PLAN  FU EMB. Mirena IUD discussed in detail.  Risks and benefits reviewed. She is uncertain if she wishes to do this.  Consider Seasonale. FU [place] in 3 months. Refer to Heme Onc.   An After Visit Summary was printed and given to the patient.  __25____ minutes face to face time of which over 50% was spent in counseling.

## 2018-12-05 NOTE — Progress Notes (Signed)
Opened in error

## 2018-12-11 ENCOUNTER — Telehealth: Payer: Self-pay | Admitting: *Deleted

## 2018-12-11 MED ORDER — LEVONORGEST-ETH ESTRAD 91-DAY 0.15-0.03 MG PO TABS: 1.0000 | ORAL_TABLET | Freq: Every day | ORAL | 0 refills | Status: DC

## 2018-12-11 NOTE — Telephone Encounter (Signed)
Patient returned call

## 2018-12-11 NOTE — Telephone Encounter (Signed)
Returned call to patient. Results reviewed with patient as seen below from Dr. Quincy Simmonds and she verbalized understanding. Patient open to trying the Seasonale. Pharmacy confirmed as [facility] on [address]. 3 month follow up PUS scheduled for 03-06-2019 at 0830 with 0900 consult with Dr. Quincy Simmonds. Patient agreeable to date and time of appointment. Future order is present for PUS.  Prescription for Seasonale #3, 0RF sent to confirmed pharmacy on file.   Routing to provider and will close encounter.

## 2018-12-11 NOTE — Telephone Encounter (Signed)
Message left to return call to Triage Nurse at [phone].    

## 2018-12-11 NOTE — Telephone Encounter (Signed)
-----   Message from Nunzio Cobbs, MD sent at 12/10/2018  3:20 PM EDT ----- Please contact patient with results of endometrial biopsy showing polypoid "polyp like" proliferative endometrium.  She does not have any polyp to remove, but it must look like a microscopic polyp under the microscope. This result reflects the estrogen which is stimulating her endometrium.  I would recommend the Seasonale birth control pills to try to get better cycle regulation.  The Prometrium does not appear to be working for her.  The patient will return for a pelvic US and recheck appointment in 3 months, so this could work well to reassess the Seasonale if she has decided to proceed forward with this option.

## 2018-12-19 NOTE — Progress Notes (Signed)
[facility] [phone]   Fax:([phone]) [place] NOTE  Patient Care Team: Janie Morning, DO as PCP - General (Family Medicine)  Hematological/Oncological History # Leukocytosis 1) 08/02/2009: WBC 9.5, WNL 2) 08/17/2009: WBC 11.6, Hgb 6.4, Plt 390. WBC with normal differential No interval records. 3) 11/19/18: WBC 11.8, Hgb 12.8, WBC 385 4) Establish care with Dr. Lorenso Courier  CHIEF COMPLAINTS/PURPOSE OF CONSULTATION:  Leukocytosis  HISTORY OF PRESENTING ILLNESS:  Vicki Sims 33 y.o. female with medical history significant for abnormal uterine bleeding, iron deficiency anemia, hidradenitis and DM type II who presents for evaluation of a leukocytosis.   On exam today Vicki Sims notes she feels well. She reports she has had chronic issues with vaginal bleeding and heavy periods. She notes in the past he periods would last 3-4 months. She established with Ob/Gyn who started different birth controls to help. During this period of time she developed marked iron deficiency anemia (noted to be Hgb 6.4 in 08/17/2009). She was started on "high dose" iron which couple with better gyn bleeding control resulted in stable Hgb. She d/c the iron pills "years ago". She notes her gyn bleeding is currently under better control. She reports no bruising or dark stools, however she does endorse some bleeding from her gums after bruising her teeth.   She also notes a longstanding history of hidradenitis. She has had 6 surgeries, and has had the sweat glands removed from under her arms. She was on spironolactone, but this was d/c 2-3 weeks ago. She is currently using topical clindamycin and doxycycline therapy. She notes she currently has active lesions under her breasts with a recent lesion resolved under her right arm. She has no recent infectious symptoms. She denies fevers, chills, sweats, nausea, or vomiting.   On review of prior records we only have 2 prior WBC counts. The  initial elevation was in 08/17/2009 at 11.6. There was a normal differential at that time. Of note, she had a Hgb of 6.4 with an MCV of 60.2. She had a normal WBC 3 weeks earlier on 08/02/2009, with a similarly low Hgb of 6.4. The clinical context at this time is not clear as there is no available documentation.   A full 10 point ROS is listed below.    MEDICAL HISTORY:  Past Medical History:  Diagnosis Date  . Abnormal uterine bleeding   . Anemia 2019   treated with "high dose" iron tablets  . Diabetes mellitus without complication ([place])   . Dysmenorrhea   . Hidradenitis   . Hypertension     SURGICAL HISTORY: Past Surgical History:  Procedure Laterality Date  . CHOLECYSTECTOMY    . excession of sweat glands     under both arms,breasts.abdomen and left groin  . wisdom teeth      SOCIAL HISTORY: Social History   Socioeconomic History  . Marital status: Single    Spouse name: Not on file  . Number of children: Not on file  . Years of education: Not on file  . Highest education level: Not on file  Occupational History  . Not on file  Social Needs  . Financial resource strain: Not on file  . Food insecurity    Worry: Not on file    Inability: Not on file  . Transportation needs    Medical: Not on file    Non-medical: Not on file  Tobacco Use  . Smoking status: Never Smoker  . Smokeless tobacco: Never Used  Substance  and Sexual Activity  . Alcohol use: Yes    Comment: social drinker  . Drug use: Never  . Sexual activity: Yes    Birth control/protection: Condom    Comment: condoms most of time  Lifestyle  . Physical activity    Days per week: Not on file    Minutes per session: Not on file  . Stress: Not on file  Relationships  . Social Herbalist on phone: Not on file    Gets together: Not on file    Attends religious service: Not on file    Active member of club or organization: Not on file    Attends meetings of clubs or organizations: Not on file     Relationship status: Not on file  . Intimate partner violence    Fear of current or ex partner: Not on file    Emotionally abused: Not on file    Physically abused: Not on file    Forced sexual activity: Not on file  Other Topics Concern  . Not on file  Social History Narrative  . Not on file    FAMILY HISTORY: Family History  Problem Relation Age of Onset  . Hypertension Father   . Cancer Father 77       liver cancer  . Diabetes Sister   . Diabetes Maternal Grandmother   . Stroke Maternal Grandmother   . Cancer Maternal Grandfather        lung cancer    ALLERGIES:  is allergic to metformin and related and penicillins.  MEDICATIONS:  Current Outpatient Medications  Medication Sig Dispense Refill  . Dulaglutide (TRULICITY) 7.56 EP/3.2RJ SOPN Inject into the skin once a week.    . doxycycline (DORYX) 100 MG EC tablet Take 100 mg by mouth 2 (two) times daily.    Marland Kitchen glipiZIDE (GLUCOTROL XL) 2.5 MG 24 hr tablet Take 2.5 mg by mouth daily with breakfast.    . levonorgestrel-ethinyl estradiol (SEASONALE) 0.15-0.03 MG tablet Take 1 tablet by mouth daily. 3 Package 0  . Nutritional Supplements (DHEA) 15-50 MG CAPS Take 1 tablet by mouth daily.    . Probiotic Product (PROBIOTIC ADVANCED PO) Take 1 tablet by mouth daily. Vaginal health probiotic     No current facility-administered medications for this visit.     REVIEW OF SYSTEMS:   Constitutional: ( - ) fevers, ( - )  chills , ( - ) night sweats Eyes: ( - ) blurriness of vision, ( - ) double vision, ( - ) watery eyes Ears, nose, mouth, throat, and face: ( - ) mucositis, ( - ) sore throat Respiratory: ( - ) cough, ( - ) dyspnea, ( - ) wheezes Cardiovascular: ( - ) palpitation, ( - ) chest discomfort, ( - ) lower extremity swelling Gastrointestinal:  ( - ) nausea, ( - ) heartburn, ( - ) change in bowel habits Skin: ( + ) abnormal skin rashes Lymphatics: ( - ) new lymphadenopathy, ( - ) easy bruising (+) bleeding gums  Neurological: ( - ) numbness, ( - ) tingling, ( - ) new weaknesses Behavioral/Psych: ( - ) mood change, ( - ) new changes  All other systems were reviewed with the patient and are negative.  PHYSICAL EXAMINATION: ECOG PERFORMANCE STATUS: 0 - Asymptomatic  Vitals:   12/20/18 0906  BP: 115/81  Pulse: 67  Resp: 17  Temp: 98.2 F (36.8 C)  SpO2: 100%   Filed Weights   12/20/18 0906  Weight: 289  lb 11.2 oz (131.4 kg)    GENERAL: well appearing young African American female in NAD  SKIN: scarring under the arms from prior sweat gland removal. Active hidradenitis lesions under the breasts with marked scarring in band under breast.  EYES: conjunctiva are pink and non-injected, sclera clear LUNGS: clear to auscultation and percussion with normal breathing effort HEART: regular rate & rhythm and no murmurs and no lower extremity edema ABDOMEN: soft, non-tender, non-distended, normal bowel sounds Musculoskeletal: no cyanosis of digits and no clubbing  PSYCH: alert & oriented x 3, fluent speech NEURO: no focal motor/sensory deficits  LABORATORY DATA:  I have reviewed the data as listed Lab Results  Component Value Date   WBC 11.3 (H) 12/20/2018   HGB 12.1 12/20/2018   HCT 38.5 12/20/2018   MCV 79.4 (L) 12/20/2018   PLT 327 12/20/2018   NEUTROABS PENDING 12/20/2018    PATHOLOGY: None to review  BLOOD FILM:  I personally reviewed the patient's peripheral blood smear today.  There was no peripheral blast.  The white blood cells were increased in number with rare precusor forms. Left shifting evident. Rbc were of normal morphology. There was no schistocytosis or anisocytosis.  The platelets are of normal size however there was rare small clumping evident.   RADIOGRAPHIC STUDIES: No relevant radiographic studies.   ASSESSMENT & PLAN Vicki Sims 33 y.o. female with medical history significant for abnormal uterine bleeding, iron deficiency anemia, hidradenitis and DM type II  who presents for evaluation of a leukocytosis.  The differential for leukocytosis is broad and includes response to inflammation/infection, medications, smoking, asplenia, and hematological malignancies/MPN. On review of her prior CBC there is a normal differential, with normal Hgb and Plt. This is reassuring that her findings have a benign cause. We will review a peripheral blood film to assure there are no dysmorphic appearing cells.   At this time we will conduct an inflammatory workup. She currently has no infectious symptoms meriting an infectious workup. Typically these elevations are transient and dissipate in time. It is likely that her chronic hidradenitis is causing inflammation and mildly increasing the WBC count. We will review her inflammatory markers today. Assuming her WBC is stable and differential is normal we will recommend monitoring of her CBCs with her primary care provider.   #Leukcotyosis, chronic --previously noted to have a normal differential, which is reassuring.  --patient has hidradenitis with actively inflamed lesions. This low grade infectious/inflammatory process is the likely source of her leukocytosis.  --will order a repeat CBC w/ diff, additionally will collect CMP, LDH, ESR/CRP, and a peripheral blood film for review. --RTC in 3 months if WBC elevated and other markers are normal. If inflammatory markers are elevated can have patient continue to follow with PCP and f/u with [place] PRN.  #Iron Deficiency Anemia, resolved --last Hgb 12.8 in Oct 2020 --repeat CBC as above --will check iron, TIBC, and ferritin to assure patient is iron replete.   Orders Placed This Encounter  Procedures  . CBC with Differential (Cancer Center Only)    Standing Status:   Future    Number of Occurrences:   1    Standing Expiration Date:   12/20/2019  . Save Smear (SSMR)    Standing Status:   Future    Number of Occurrences:   1    Standing Expiration Date:   12/20/2019  .  Sedimentation rate    Standing Status:   Future    Number of Occurrences:   1  Standing Expiration Date:   12/20/2019  . Lactate dehydrogenase (LDH)    Standing Status:   Future    Number of Occurrences:   1    Standing Expiration Date:   12/20/2019  . Iron and TIBC    Standing Status:   Future    Number of Occurrences:   1    Standing Expiration Date:   12/20/2019  . Ferritin    Standing Status:   Future    Number of Occurrences:   1    Standing Expiration Date:   12/20/2019  . TSH    Standing Status:   Future    Number of Occurrences:   1    Standing Expiration Date:   12/20/2019  . Erythropoietin    Standing Status:   Future    Number of Occurrences:   1    Standing Expiration Date:   12/20/2019  . Vitamin B12    Standing Status:   Future    Number of Occurrences:   1    Standing Expiration Date:   12/20/2019  . Folate, Serum    Standing Status:   Future    Number of Occurrences:   1    Standing Expiration Date:   12/20/2019  . C-reactive protein    Standing Status:   Future    Number of Occurrences:   1    Standing Expiration Date:   12/20/2019    All questions were answered. The patient knows to call the clinic with any problems, questions or concerns.  A total of more than 60 minutes were spent face-to-face with the patient during this encounter and over half of that time was spent on counseling and coordination of care as outlined above.   Ledell Peoples, MD Department of Hematology/Oncology [place] at [facility] Phone: [phone] Pager: [phone] Email: [email or web address]   12/20/2018 10:27 AM

## 2018-12-20 ENCOUNTER — Other Ambulatory Visit: Payer: Self-pay

## 2018-12-20 ENCOUNTER — Inpatient Hospital Stay: Payer: 59

## 2018-12-20 ENCOUNTER — Inpatient Hospital Stay: Payer: 59 | Attending: Hematology and Oncology | Admitting: Hematology and Oncology

## 2018-12-20 VITALS — BP 115/81 | HR 67 | Temp 98.2°F | Resp 17 | Ht 66.0 in | Wt 289.7 lb

## 2018-12-20 DIAGNOSIS — N939 Abnormal uterine and vaginal bleeding, unspecified: Secondary | ICD-10-CM | POA: Diagnosis not present

## 2018-12-20 DIAGNOSIS — N920 Excessive and frequent menstruation with regular cycle: Secondary | ICD-10-CM

## 2018-12-20 DIAGNOSIS — L732 Hidradenitis suppurativa: Secondary | ICD-10-CM | POA: Diagnosis not present

## 2018-12-20 DIAGNOSIS — D72829 Elevated white blood cell count, unspecified: Secondary | ICD-10-CM | POA: Diagnosis present

## 2018-12-20 DIAGNOSIS — D500 Iron deficiency anemia secondary to blood loss (chronic): Secondary | ICD-10-CM

## 2018-12-20 LAB — CBC WITH DIFFERENTIAL (CANCER CENTER ONLY)
Abs Immature Granulocytes: 0.05 10*3/uL (ref 0.00–0.07)
Basophils Absolute: 0 10*3/uL (ref 0.0–0.1)
Basophils Relative: 0 %
Eosinophils Absolute: 0.1 10*3/uL (ref 0.0–0.5)
Eosinophils Relative: 1 %
HCT: 38.5 % (ref 36.0–46.0)
Hemoglobin: 12.1 g/dL (ref 12.0–15.0)
Immature Granulocytes: 0 %
Lymphocytes Relative: 28 %
Lymphs Abs: 3.1 10*3/uL (ref 0.7–4.0)
MCH: 24.9 pg — ABNORMAL LOW (ref 26.0–34.0)
MCHC: 31.4 g/dL (ref 30.0–36.0)
MCV: 79.4 fL — ABNORMAL LOW (ref 80.0–100.0)
Monocytes Absolute: 0.5 10*3/uL (ref 0.1–1.0)
Monocytes Relative: 5 %
Neutro Abs: 7.4 10*3/uL (ref 1.7–7.7)
Neutrophils Relative %: 66 %
Platelet Count: 327 10*3/uL (ref 150–400)
RBC: 4.85 MIL/uL (ref 3.87–5.11)
RDW: 15.1 % (ref 11.5–15.5)
WBC Count: 11.3 10*3/uL — ABNORMAL HIGH (ref 4.0–10.5)
nRBC: 0 % (ref 0.0–0.2)

## 2018-12-20 LAB — FERRITIN: Ferritin: 24 ng/mL (ref 11–307)

## 2018-12-20 LAB — IRON AND TIBC
Iron: 40 ug/dL — ABNORMAL LOW (ref 41–142)
Saturation Ratios: 10 % — ABNORMAL LOW (ref 21–57)
TIBC: 396 ug/dL (ref 236–444)
UIBC: 357 ug/dL (ref 120–384)

## 2018-12-20 LAB — FOLATE: Folate: 5.9 ng/mL — ABNORMAL LOW (ref >5.9)

## 2018-12-20 LAB — VITAMIN B12: Vitamin B-12: 294 pg/mL (ref 180–914)

## 2018-12-20 LAB — TSH: TSH: 1.311 u[IU]/mL (ref 0.308–3.960)

## 2018-12-20 LAB — SAVE SMEAR(SSMR), FOR PROVIDER SLIDE REVIEW

## 2018-12-20 LAB — SEDIMENTATION RATE: Sed Rate: 36 mm/hr — ABNORMAL HIGH (ref 0–22)

## 2018-12-20 LAB — LACTATE DEHYDROGENASE: LDH: 127 U/L (ref 98–192)

## 2018-12-20 LAB — C-REACTIVE PROTEIN: CRP: 2.1 mg/dL — ABNORMAL HIGH (ref <1.0)

## 2018-12-21 LAB — ERYTHROPOIETIN: Erythropoietin: 31.1 m[IU]/mL — ABNORMAL HIGH (ref 2.6–18.5)

## 2018-12-23 ENCOUNTER — Other Ambulatory Visit: Payer: Self-pay | Admitting: Hematology and Oncology

## 2018-12-23 MED ORDER — FERROUS SULFATE 325 (65 FE) MG PO TBEC: 325.0000 mg | DELAYED_RELEASE_TABLET | Freq: Every day | ORAL | 1 refills | Status: AC

## 2018-12-23 NOTE — Progress Notes (Signed)
Called patient regarding results of her labs. Low grade inflammation from hidradenitis is likely source of elevated WBC. Recommend routine measurement with PCP of CBC to assure no increase.   Also noted to have microcytosis and iron deficiency. Recommend at least 3 month of PO iron to increase total body stores of iron. Called in 325mg  ferrous sulfate PO daily to her local pharmacy.  Ledell Peoples, MD Department of Hematology/Oncology [place] at [facility] Phone: [phone] Pager: [phone] Email: [email or web address]

## 2019-02-10 ENCOUNTER — Encounter: Payer: Self-pay | Admitting: Obstetrics and Gynecology

## 2019-02-10 ENCOUNTER — Ambulatory Visit (INDEPENDENT_AMBULATORY_CARE_PROVIDER_SITE_OTHER): Payer: 59 | Admitting: Obstetrics and Gynecology

## 2019-02-10 ENCOUNTER — Other Ambulatory Visit: Payer: Self-pay

## 2019-02-10 ENCOUNTER — Telehealth: Payer: Self-pay | Admitting: Obstetrics and Gynecology

## 2019-02-10 VITALS — BP 122/82 | HR 80 | Temp 97.0°F | Ht 66.0 in | Wt 277.0 lb

## 2019-02-10 DIAGNOSIS — N921 Excessive and frequent menstruation with irregular cycle: Secondary | ICD-10-CM | POA: Diagnosis not present

## 2019-02-10 NOTE — Progress Notes (Signed)
GYNECOLOGY  VISIT   HPI: 33 y.o.   Single  African American  female   G0P0000 with No LMP recorded.   here for irregular bleeding off and on for 2 weeks--heavy at times with clots. Patient states she did miss one pill recently and took the next day.  LNMP 11-24-18 and again 12-07-18 through 12-10-18.  This abnormal bleeding began 12-03-18.  She started Seasonale on 12/11/18.   She skipped her pill on 12/24. She is on month number 3 of her Seasonale.  The missed pill was at the beginning of the pack.   Changing a pad every hour yesterday during 4 hours.  It continues this heavy.   Denies dizziness, short of breath, lightheaded, or with headaches.   Not active sexually for months.   GYNECOLOGIC HISTORY: No LMP recorded. Contraception: Seasonale OCP Menopausal hormone therapy:  n/a Last mammogram: n/a Last pap smear:  2020 normal in [place].        OB History    Gravida  0   Para  0   Term  0   Preterm  0   AB  0   Living  0     SAB  0   TAB  0   Ectopic  0   Multiple  0   Live Births  0              There are no problems to display for this patient.   Past Medical History:  Diagnosis Date  . Abnormal uterine bleeding   . Anemia 2019   treated with "high dose" iron tablets  . Diabetes mellitus without complication ([place])   . Dysmenorrhea   . Hidradenitis   . Hypertension     Past Surgical History:  Procedure Laterality Date  . CHOLECYSTECTOMY    . excession of sweat glands     under both arms,breasts.abdomen and left groin  . wisdom teeth      Current Outpatient Medications  Medication Sig Dispense Refill  . Dulaglutide (TRULICITY) 8.09 XI/3.3AS SOPN Inject into the skin once a week.    . ferrous sulfate 325 (65 FE) MG EC tablet Take 1 tablet (325 mg total) by mouth daily with breakfast. Please take with a source of vitamin C (orange juice or vitamin C tablets) and food. Do not take with milk. 90 tablet 1  . glipiZIDE (GLUCOTROL XL)  2.5 MG 24 hr tablet Take 2.5 mg by mouth daily with breakfast.    . levonorgestrel-ethinyl estradiol (SEASONALE) 0.15-0.03 MG tablet Take 1 tablet by mouth daily. 3 Package 0  . Naltrexone-buPROPion HCl (CONTRAVE PO) Take 2 tablets by mouth 2 (two) times daily.     No current facility-administered medications for this visit.     ALLERGIES: Metformin and related and Penicillins  Family History  Problem Relation Age of Onset  . Hypertension Father   . Cancer Father 16       liver cancer  . Diabetes Sister   . Diabetes Maternal Grandmother   . Stroke Maternal Grandmother   . Cancer Maternal Grandfather        lung cancer    Social History   Socioeconomic History  . Marital status: Single    Spouse name: Not on file  . Number of children: Not on file  . Years of education: Not on file  . Highest education level: Not on file  Occupational History  . Not on file  Tobacco Use  . Smoking status: Never Smoker  .  Smokeless tobacco: Never Used  Substance and Sexual Activity  . Alcohol use: Yes    Comment: social drinker  . Drug use: Never  . Sexual activity: Yes    Birth control/protection: Condom    Comment: condoms most of time  Other Topics Concern  . Not on file  Social History Narrative  . Not on file   Social Determinants of Health   Financial Resource Strain:   . Difficulty of Paying Living Expenses: Not on file  Food Insecurity:   . Worried About Programme researcher, broadcasting/film/video in the Last Year: Not on file  . Ran Out of Food in the Last Year: Not on file  Transportation Needs:   . Lack of Transportation (Medical): Not on file  . Lack of Transportation (Non-Medical): Not on file  Physical Activity:   . Days of Exercise per Week: Not on file  . Minutes of Exercise per Session: Not on file  Stress:   . Feeling of Stress : Not on file  Social Connections:   . Frequency of Communication with Friends and Family: Not on file  . Frequency of Social Gatherings with Friends and  Family: Not on file  . Attends Religious Services: Not on file  . Active Member of Clubs or Organizations: Not on file  . Attends Banker Meetings: Not on file  . Marital Status: Not on file  Intimate Partner Violence:   . Fear of Current or Ex-Partner: Not on file  . Emotionally Abused: Not on file  . Physically Abused: Not on file  . Sexually Abused: Not on file    Review of Systems  All other systems reviewed and are negative.   PHYSICAL EXAMINATION:    BP 122/82 (Cuff Size: Large)   Pulse 80   Temp (!) 97 F (36.1 C) (Temporal)   Ht 5\' 6"  (1.676 m)   Wt 277 lb (125.6 kg)   BMI 44.71 kg/m     General appearance: alert, cooperative and appears stated age   Pelvic: External genitalia:  no lesions              Urethra:  normal appearing urethra with no masses, tenderness or lesions              Bartholins and Skenes: normal                 Vagina: normal appearing vagina with normal color and discharge, no lesions              Cervix: no lesions.  Clot from os.                 Bimanual Exam:  Uterus:  normal size, contour, position, consistency, mobility, non-tender              Adnexa: no mass, fullness, tenderness        Chaperone was present for exam.  ASSESSMENT  Menorrhagia with irregular menses. Skipped OCP.  PLAN  OCP taper. CBC now.  She will call if her bleeding does not improve. We discussed Mirena and Aygestin as alternatives to her COCs. Keep January, 2021 follow up appt.   An After Visit Summary was printed and given to the patient.  __15____ minutes face to face time of which over 50% was spent in counseling.

## 2019-02-10 NOTE — Patient Instructions (Signed)
Please do a taper with your birth control pills:  Take 3 pills by mouth for 3 days, 2 pills by mouth for 2 days, and then 1 pill by mouth.  Skip your placebo pills (sugar pills) at the end of the pack, and go directly in to a new pack of Seasonale.

## 2019-02-10 NOTE — Telephone Encounter (Signed)
Patient is calling regarding excessive bleeding. Patient stated that she is going through 6-7 overnight pads per day. Patient stated that "we thought we had gotten it under control, but it's gotten back to being bad again."

## 2019-02-10 NOTE — Telephone Encounter (Signed)
Spoke to Vicki Sims. Vicki Sims states bleeding 2 weeks ago while wiping, but now an actual period started last week and normal bleeding with  having a pad a day. Vicki Sims states on 02/06/19 started having clots and heavy bleeding at times. Then last night changed 3 overnight pads in 3 hours. This morning bleeding has ceased and is manageable.  Denies pain, cramps, lightheadedness, weakness or dizzy. Vicki Sims states "thought we had gotten this bleeding under control since last OV on 12/05/18."  Vicki Sims is scheduled for [place] on 03/05/18. Vicki Sims now scheduled for OV with Dr Quincy Simmonds today at 4:30pm. Vicki Sims agreeable. Vicki Sims instructed to seek urgent medical care if has bleeding that soaks a pad an hour or less, clots bigger than her fist and feels weak or dizzy before her PM OV. Vicki Sims verbalized understanding.   Will route to Dr Quincy Simmonds for review and will close encounter.

## 2019-02-11 LAB — CBC
Hematocrit: 42 % (ref 34.0–46.6)
Hemoglobin: 13.3 g/dL (ref 11.1–15.9)
MCH: 24.9 pg — ABNORMAL LOW (ref 26.6–33.0)
MCHC: 31.7 g/dL (ref 31.5–35.7)
MCV: 79 fL (ref 79–97)
Platelets: 356 10*3/uL (ref 150–450)
RBC: 5.34 x10E6/uL — ABNORMAL HIGH (ref 3.77–5.28)
RDW: 16.5 % — ABNORMAL HIGH (ref 11.7–15.4)
WBC: 13.4 10*3/uL — ABNORMAL HIGH (ref 3.4–10.8)

## 2019-02-27 ENCOUNTER — Telehealth: Payer: Self-pay | Admitting: Obstetrics and Gynecology

## 2019-02-27 NOTE — Telephone Encounter (Signed)
Patient to call if abnormal bleeding did not get better. Seen 02/10/19 for bleeding. States it is not as bad as it was then, but she is having some clots. "Same bleeding that led up to what it was like before".

## 2019-02-27 NOTE — Telephone Encounter (Signed)
Call placed to patient to review benefit for scheduled follow up ultrasound. Left voicemail message requesting a return call

## 2019-02-27 NOTE — Telephone Encounter (Signed)
Spoke with patient, calling to provide update. Was seen 12/11/18 for irregular menses. Completing taper of COC, currently on last pill before inactive pills. On 1/10 patient started spotting, flow is light with small clots. Patient is concerned bleeding is going to increase as before. Denies any late or missed pills. No new symptoms. Does not want IUD, would consider progesterone pill, has done well with this in the past. Patient is scheduled for PUS on 03/06/19. Advised patient to keep PUS for f/u, will review with Dr. Edward Jolly and return call with recommendations, patient agreeable.   Routing to Dr. Edward Jolly.

## 2019-02-27 NOTE — Telephone Encounter (Signed)
Have patient start a new pack of birth control pills instead of taking the reminder pills so she will not have any increased bleeding at the time of her upcoming ultrasound.   I will see her next week to reassess.   Please have her call if her bleeding becomes heavy prior to the ultrasound appointment.

## 2019-02-27 NOTE — Telephone Encounter (Signed)
Spoke with patient, advised per Dr. Silva. Patient verbalizes understanding and is agreeable.  Encounter closed.  

## 2019-02-27 NOTE — Telephone Encounter (Signed)
Patient returned call. Reviewed benefit for scheduled ultrasound. Patient acknowledges understanding. Patient is scheduled 03/06/2019. Patient is aware of the appointment date, arrival time and cancellation policy. No further questions. Will close encounter

## 2019-03-04 ENCOUNTER — Other Ambulatory Visit: Payer: Self-pay

## 2019-03-06 ENCOUNTER — Ambulatory Visit (INDEPENDENT_AMBULATORY_CARE_PROVIDER_SITE_OTHER): Payer: 59

## 2019-03-06 ENCOUNTER — Encounter: Payer: Self-pay | Admitting: Obstetrics and Gynecology

## 2019-03-06 ENCOUNTER — Ambulatory Visit (INDEPENDENT_AMBULATORY_CARE_PROVIDER_SITE_OTHER): Payer: 59 | Admitting: Obstetrics and Gynecology

## 2019-03-06 ENCOUNTER — Other Ambulatory Visit: Payer: Self-pay

## 2019-03-06 VITALS — BP 122/74 | HR 80 | Temp 96.5°F | Ht 66.0 in | Wt 281.0 lb

## 2019-03-06 DIAGNOSIS — N83202 Unspecified ovarian cyst, left side: Secondary | ICD-10-CM

## 2019-03-06 DIAGNOSIS — N83201 Unspecified ovarian cyst, right side: Secondary | ICD-10-CM | POA: Diagnosis not present

## 2019-03-06 NOTE — Progress Notes (Addendum)
GYNECOLOGY  VISIT   HPI: 34 y.o.   Single  African American  female   [place] with Patient's last menstrual period was 02/22/2018 (exact date).   here for pelvic ultrasound.   Patient is being followed for menorrhagia with irregular menses and a right ovarian cyst which is consistent with a possible dermoid.   Pelvic US from 12/05/18: Uterus no masses. EMS 5.10 mm.  Right ovary 16 x 14 mm cyst consistent with dermoid cyst. No abnormal blood flow. Left ovary normal. No free fluid.   She had a sonohysterogram on the same day, and there was not filling defect noted.  Her EMB showed benign polypoid proliferative endometrium.   She started on Seasonale following this.  She had to do a birth control pill taper.  She has declind an IUD.   GYNECOLOGIC HISTORY: Patient's last menstrual period was 02/22/2018 (exact date). Contraception: Seasonale Menopausal hormone therapy:  n/a Last mammogram:  n/a Last pap smear: 2020 normal in [place]        OB History    Gravida  0   Para  0   Term  0   Preterm  0   AB  0   Living  0     SAB  0   TAB  0   Ectopic  0   Multiple  0   Live Births  0              There are no problems to display for this patient.   Past Medical History:  Diagnosis Date  . Abnormal uterine bleeding   . Anemia 2019   treated with "high dose" iron tablets  . Diabetes mellitus without complication ([place])   . Dysmenorrhea   . Hidradenitis   . Hypertension     Past Surgical History:  Procedure Laterality Date  . CHOLECYSTECTOMY    . excession of sweat glands     under both arms,breasts.abdomen and left groin  . wisdom teeth      Current Outpatient Medications  Medication Sig Dispense Refill  . Dulaglutide (TRULICITY) 6.26 RS/8.5IO SOPN Inject into the skin once a week.    . ferrous sulfate 325 (65 FE) MG EC tablet Take 1 tablet (325 mg total) by mouth daily with breakfast. Please take with a source of vitamin C (orange juice or  vitamin C tablets) and food. Do not take with milk. 90 tablet 1  . glipiZIDE (GLUCOTROL XL) 2.5 MG 24 hr tablet Take 2.5 mg by mouth daily with breakfast.    . levonorgestrel-ethinyl estradiol (SEASONALE) 0.15-0.03 MG tablet Take 1 tablet by mouth daily. 3 Package 0  . Naltrexone-buPROPion HCl (CONTRAVE PO) Take 2 tablets by mouth 2 (two) times daily.    Marland Kitchen spironolactone (ALDACTONE) 100 MG tablet Take 100 mg by mouth 2 (two) times daily.     No current facility-administered medications for this visit.     ALLERGIES: Metformin and related and Penicillins  Family History  Problem Relation Age of Onset  . Hypertension Father   . Cancer Father 8       liver cancer  . Diabetes Sister   . Diabetes Maternal Grandmother   . Stroke Maternal Grandmother   . Cancer Maternal Grandfather        lung cancer    Social History   Socioeconomic History  . Marital status: Single    Spouse name: Not on file  . Number of children: Not on file  . Years of education:  Not on file  . Highest education level: Not on file  Occupational History  . Not on file  Tobacco Use  . Smoking status: Never Smoker  . Smokeless tobacco: Never Used  Substance and Sexual Activity  . Alcohol use: Yes    Comment: social drinker  . Drug use: Never  . Sexual activity: Yes    Birth control/protection: Condom    Comment: condoms most of time  Other Topics Concern  . Not on file  Social History Narrative  . Not on file   Social Determinants of Health   Financial Resource Strain:   . Difficulty of Paying Living Expenses: Not on file  Food Insecurity:   . Worried About Programme researcher, broadcasting/film/video in the Last Year: Not on file  . Ran Out of Food in the Last Year: Not on file  Transportation Needs:   . Lack of Transportation (Medical): Not on file  . Lack of Transportation (Non-Medical): Not on file  Physical Activity:   . Days of Exercise per Week: Not on file  . Minutes of Exercise per Session: Not on file    Stress:   . Feeling of Stress : Not on file  Social Connections:   . Frequency of Communication with Friends and Family: Not on file  . Frequency of Social Gatherings with Friends and Family: Not on file  . Attends Religious Services: Not on file  . Active Member of Clubs or Organizations: Not on file  . Attends Banker Meetings: Not on file  . Marital Status: Not on file  Intimate Partner Violence:   . Fear of Current or Ex-Partner: Not on file  . Emotionally Abused: Not on file  . Physically Abused: Not on file  . Sexually Abused: Not on file    Review of Systems  All other systems reviewed and are negative.   PHYSICAL EXAMINATION:    BP 122/74 (Cuff Size: Large)   Pulse 80   Temp (!) 96.5 F (35.8 C) (Temporal)   Ht 5\' 6"  (1.676 m)   Wt 281 lb (127.5 kg)   LMP 02/22/2018 (Exact Date)   BMI 45.35 kg/m     General appearance: alert, cooperative and appears stated age  Pelvic 04/23/2018 Uterus no masses.  EMS 5.12 mm.  Right ovary with 16 x 17 mm echogenic area consistent with dermoid cyst (was 16 x 14 mm in Oct, 2020.) Left ovary with 11. 9 mm echogenic area consistent with dermoid cyst.  No free fluid noted.   ASSESSMENT  Menorrhagia with irregular menses.  On Seasonale.  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   PLAN  I reached out to the patient by phone to discuss her 04-27-1998 report and had to leave a message for her to call back.  Continue Seasonale.  Plan for follow up US in 3 months.

## 2019-03-11 ENCOUNTER — Telehealth: Payer: Self-pay | Admitting: Obstetrics and Gynecology

## 2019-03-11 NOTE — Telephone Encounter (Signed)
Patient has bilateral ovarian echogenicity consistent with small dermoid cysts.   I do recommend she have another follow up ultrasound in 3 months to determine if these are growing.   I hope her bleeding is improved on the Seasonale.

## 2019-03-11 NOTE — Telephone Encounter (Signed)
Phone call to patient earlier to day to discuss her [place] from 03/06/19.  No answer.  I left a message to return by phone call.

## 2019-03-14 NOTE — Telephone Encounter (Signed)
Please reach out to patient to be sure she has received the results of her US showing she has what appear to be small dermoid cysts now on each ovary.   I recommend follow up ultrasound in 3 months.   Please see how her bleeding is doing on her Seasonale.

## 2019-03-17 NOTE — Telephone Encounter (Signed)
Spoke to pt. Pt states had not seen [place] results. Pt given results and has scheduled 3 month follow up US on 05/27/2019 at 1230 with an OV to follow with Dr Edward Jolly. Pt agreeable and verbalized understanding. Pt also states having BTB after missing another pill last week, but before missing pill, pt states bleeding so much better.   Routing to Dr Edward Jolly for review and update from pt. Encounter closed.

## 2019-05-05 ENCOUNTER — Other Ambulatory Visit: Payer: Self-pay | Admitting: *Deleted

## 2019-05-05 NOTE — Telephone Encounter (Signed)
Medication refill request: seasonale  Last OV:  03-06-19 BS Next OV: 05-27-19 Last MMG (if hormonal medication request): n/a Refill authorized: Today, please advise.   Call to patient. Confirmed refill request of seasonale from [facility] in [place], [place]. Patient confirmed pharmacy.   Medication pended for #1, 0RF. Please refill if appropriate.

## 2019-05-06 MED ORDER — LEVONORGEST-ETH ESTRAD 91-DAY 0.15-0.03 MG PO TABS: 1.0000 | ORAL_TABLET | Freq: Every day | ORAL | 0 refills | Status: DC

## 2019-05-14 ENCOUNTER — Telehealth: Payer: Self-pay | Admitting: Obstetrics and Gynecology

## 2019-05-14 NOTE — Telephone Encounter (Signed)
Spoke with patient regarding benefits for recommended ultrasound. Patient acknowledges understanding of information presented. Patient cancelled scheduled ultrasound and stated that she would call back to reschedule at a later date.  Routing to Dr. Edward Jolly for [facility] and to advise.

## 2019-05-15 NOTE — Telephone Encounter (Signed)
Spoke with patient. Patient reports BTB is better, but still comes and goes, notices only when wiping. Reports bleeding started 2 wks prior to placebo pills, she was going out of town, so she skipped the placebo week and started into a new pack. She is taking Seasonale. Denies any missed or late pills.   Advised as seen below per Dr. Edward Jolly. Patient declines PUS or OV at this time. Patient states she will "think on it and return call at a later time". Reviewed options of alternative locations for PUS. Patient verbalizes understanding.   Routing to Dr. Edward Jolly.   Cc: Hayley Carder

## 2019-05-15 NOTE — Telephone Encounter (Signed)
Please contact patient in follow up to her abnormal uterine bleeding with bilateral ovarian cysts, thought to be consistent with dermoid cysts.   She is currently on oral contraceptives.   How is her bleeding doing?  She has declined her follow up pelvic US.  Her alternatives for this are to recheck at a later date, like in June, 2021 or go to an alternative location like [facility].

## 2019-05-15 NOTE — Telephone Encounter (Signed)
Patient placed in IMG hold.   Encounter closed.  

## 2019-05-15 NOTE — Telephone Encounter (Signed)
Thank you for the update.  Please place her in imaging recall for July, 2021.

## 2019-05-27 ENCOUNTER — Other Ambulatory Visit: Payer: Self-pay

## 2019-05-27 ENCOUNTER — Other Ambulatory Visit: Payer: Self-pay | Admitting: Obstetrics and Gynecology

## 2019-07-31 ENCOUNTER — Other Ambulatory Visit: Payer: Self-pay | Admitting: Obstetrics and Gynecology

## 2019-07-31 NOTE — Telephone Encounter (Signed)
Please reach out to patient to check on her bleeding profile on Seasonale.  She has had irregular bleeding and also has possible bilateral dermoid cysts and has declined follow up imaging.

## 2019-07-31 NOTE — Telephone Encounter (Signed)
Medication refill request: Seasonale  Last OV:  03/06/19 Dr. Edward Jolly Next AEX: none scheduled Last MMG (if hormonal medication request): n/a Refill authorized: Please advise

## 2019-08-01 NOTE — Telephone Encounter (Signed)
Spoke with pt. Pt states having only light spotting or sometimes dark blood when wiping on 2nd week of pills. Pt states not wearing any pad. Pt states has happened for last 2 packs of pills. Pt states much better than before. Pt states no bleeding or spotting on weeks 1, 3 and 4 on pill pack. Pt declines PUS at this time.  Advised will give update to Dr Edward Jolly and return call with recommendations. Pt agreeable.   Routing to Dr Edward Jolly.

## 2019-08-03 NOTE — Telephone Encounter (Signed)
Please schedule an appointment for annual exam for October, 2021.  Ok for refill of OCPs until annual exam.

## 2019-08-04 MED ORDER — LEVONORGEST-ETH ESTRAD 91-DAY 0.15-0.03 MG PO TABS: 1.0000 | ORAL_TABLET | Freq: Every day | ORAL | 3 refills | Status: DC

## 2019-08-04 NOTE — Telephone Encounter (Signed)
Spoke with pt. Pt given update and recommendations per Dr Edward Jolly. Pt agreeable. Pt scheduled for AEX on 11/20/19 at 10 am. Pt verbalized understanding. Rx for OCPs sent to pharmacy on file. # 30, 3 RF until AEX appt.   Routing to Dr Edward Jolly for review.  Encounter closed.

## 2019-08-26 ENCOUNTER — Telehealth: Payer: Self-pay | Admitting: *Deleted

## 2019-08-26 NOTE — Telephone Encounter (Signed)
Left message to call Noreene Larsson, RN at [facility] [phone].    Patient is in IMG hold for PUS for AUB and f/u of bilateral ovarian cysts.  PUS has not been scheduled to date.

## 2019-09-10 NOTE — Telephone Encounter (Signed)
Removed from IMG hold.   Encounter closed.  

## 2019-09-10 NOTE — Telephone Encounter (Signed)
Spoke with patient. Patient declines to schedule f/u PUS for AUB and bilateral ovarian cysts. Patient reports her menses have been regular since she has been on Seasonale, has menses q3 months and they typically last 3-4 days. Patient denies any other GYN symptoms.   Offered to schedule PUS at a later date or outside facility, if needed, patient again declined. Advised patient I will provide update to Dr. Edward Jolly and return call with any additional recommendations.   Routing to Dr. Edward Jolly

## 2019-09-10 NOTE — Telephone Encounter (Signed)
I will see patient for her annual exam in October.  We can reassess at that time.

## 2019-10-29 ENCOUNTER — Other Ambulatory Visit: Payer: Self-pay | Admitting: *Deleted

## 2019-10-29 MED ORDER — LEVONORGEST-ETH ESTRAD 91-DAY 0.15-0.03 MG PO TABS: 1.0000 | ORAL_TABLET | Freq: Every day | ORAL | 0 refills | Status: AC

## 2019-10-29 NOTE — Telephone Encounter (Signed)
Medication refill request: OCP  Last AEX:  11-19-2018 BS Next AEX: 11-20-19 Last MMG (if hormonal medication request): n/a Refill authorized: Today, please advise.   Call to patient. Patient states insurance is now requiring patient to use [facility] to fill medications. Patient requesting refill of OCP prior to AEX on 11-20-19.   Medication pended for #28, 0RF. Please refill if appropriate.

## 2019-11-20 ENCOUNTER — Ambulatory Visit: Payer: Self-pay | Admitting: Obstetrics and Gynecology
